# Patient Record
Sex: Female | Born: 1945 | Race: White | Hispanic: No | Smoking: Current every day smoker
Health system: Southern US, Community
[De-identification: ages and names within clinical notes are randomized; demographics above are authoritative.]

## PROBLEM LIST (undated history)

## (undated) DIAGNOSIS — N200 Calculus of kidney: Secondary | ICD-10-CM

## (undated) DIAGNOSIS — Q615 Medullary cystic kidney: Secondary | ICD-10-CM

## (undated) DIAGNOSIS — I2510 Atherosclerotic heart disease of native coronary artery without angina pectoris: Secondary | ICD-10-CM

## (undated) DIAGNOSIS — R55 Syncope and collapse: Secondary | ICD-10-CM

## (undated) DIAGNOSIS — I219 Acute myocardial infarction, unspecified: Secondary | ICD-10-CM

## (undated) DIAGNOSIS — F32A Depression, unspecified: Secondary | ICD-10-CM

## (undated) DIAGNOSIS — I240 Acute coronary thrombosis not resulting in myocardial infarction: Secondary | ICD-10-CM

## (undated) DIAGNOSIS — M1990 Unspecified osteoarthritis, unspecified site: Secondary | ICD-10-CM

## (undated) DIAGNOSIS — F419 Anxiety disorder, unspecified: Secondary | ICD-10-CM

## (undated) DIAGNOSIS — E785 Hyperlipidemia, unspecified: Secondary | ICD-10-CM

## (undated) DIAGNOSIS — E119 Type 2 diabetes mellitus without complications: Secondary | ICD-10-CM

## (undated) DIAGNOSIS — F329 Major depressive disorder, single episode, unspecified: Secondary | ICD-10-CM

## (undated) DIAGNOSIS — M2550 Pain in unspecified joint: Secondary | ICD-10-CM

## (undated) DIAGNOSIS — I10 Essential (primary) hypertension: Secondary | ICD-10-CM

## (undated) DIAGNOSIS — C539 Malignant neoplasm of cervix uteri, unspecified: Secondary | ICD-10-CM

## (undated) HISTORY — DX: Syncope and collapse: R55

## (undated) HISTORY — DX: Major depressive disorder, single episode, unspecified: F32.9

## (undated) HISTORY — DX: Acute myocardial infarction, unspecified: I21.9

## (undated) HISTORY — DX: Type 2 diabetes mellitus without complications: E11.9

## (undated) HISTORY — DX: Acute coronary thrombosis not resulting in myocardial infarction: I24.0

## (undated) HISTORY — DX: Medullary cystic kidney: Q61.5

## (undated) HISTORY — DX: Atherosclerotic heart disease of native coronary artery without angina pectoris: I25.10

## (undated) HISTORY — DX: Pain in unspecified joint: M25.50

## (undated) HISTORY — DX: Calculus of kidney: N20.0

## (undated) HISTORY — DX: Unspecified osteoarthritis, unspecified site: M19.90

## (undated) HISTORY — PX: REPLACEMENT TOTAL KNEE: SUR1224

## (undated) HISTORY — PX: OTHER SURGICAL HISTORY: SHX169

## (undated) HISTORY — DX: Anxiety disorder, unspecified: F41.9

## (undated) HISTORY — DX: Malignant neoplasm of cervix uteri, unspecified: C53.9

## (undated) HISTORY — DX: Depression, unspecified: F32.A

## (undated) HISTORY — DX: Hyperlipidemia, unspecified: E78.5

## (undated) HISTORY — DX: Essential (primary) hypertension: I10

---

## 2013-05-09 DIAGNOSIS — R928 Other abnormal and inconclusive findings on diagnostic imaging of breast: Secondary | ICD-10-CM | POA: Insufficient documentation

## 2014-10-15 DIAGNOSIS — M533 Sacrococcygeal disorders, not elsewhere classified: Secondary | ICD-10-CM | POA: Insufficient documentation

## 2016-10-21 ENCOUNTER — Ambulatory Visit (INDEPENDENT_AMBULATORY_CARE_PROVIDER_SITE_OTHER): Payer: Medicare Other | Admitting: Cardiovascular Disease

## 2016-10-21 ENCOUNTER — Encounter: Payer: Self-pay | Admitting: Cardiovascular Disease

## 2016-10-21 ENCOUNTER — Telehealth: Payer: Self-pay | Admitting: Cardiovascular Disease

## 2016-10-21 VITALS — BP 108/72 | HR 71 | Ht 69.0 in | Wt 209.0 lb

## 2016-10-21 DIAGNOSIS — F17210 Nicotine dependence, cigarettes, uncomplicated: Secondary | ICD-10-CM | POA: Diagnosis not present

## 2016-10-21 DIAGNOSIS — Z955 Presence of coronary angioplasty implant and graft: Secondary | ICD-10-CM

## 2016-10-21 DIAGNOSIS — I10 Essential (primary) hypertension: Secondary | ICD-10-CM

## 2016-10-21 DIAGNOSIS — I25118 Atherosclerotic heart disease of native coronary artery with other forms of angina pectoris: Secondary | ICD-10-CM | POA: Diagnosis not present

## 2016-10-21 DIAGNOSIS — R600 Localized edema: Secondary | ICD-10-CM | POA: Diagnosis not present

## 2016-10-21 DIAGNOSIS — E785 Hyperlipidemia, unspecified: Secondary | ICD-10-CM | POA: Diagnosis not present

## 2016-10-21 DIAGNOSIS — Z9889 Other specified postprocedural states: Secondary | ICD-10-CM

## 2016-10-21 DIAGNOSIS — R0609 Other forms of dyspnea: Secondary | ICD-10-CM | POA: Diagnosis not present

## 2016-10-21 DIAGNOSIS — Z716 Tobacco abuse counseling: Secondary | ICD-10-CM

## 2016-10-21 NOTE — Telephone Encounter (Signed)
Echo scheduled in [facility] 11/04/16  Exercise myoview - scheduled at [facility] 11/10/16

## 2016-10-21 NOTE — Progress Notes (Signed)
CARDIOLOGY CONSULT NOTE  Patient ID: Sandy Meyer MRN: [id number] DOB/AGE: 08/03/1945 71 y.o.  Admit date: (Not on file) Primary Physician: Jessee Avers, FNP Referring Physician: Eulas Post FNP  Reason for Consultation: CAD  HPI: Sandy Meyer is a 71 y.o. female who is being seen today for the evaluation of CAD at the request of Jessee Avers, FNP.   She has a history of coronary artery disease, hypertension, and hyperlipidemia. She recently moved here from [place], [place] area she reportedly had a PCI of the LAD in 2001 by Dr. Lissa Merlin in [place], [place].  At the time she had been power walking 16 miles per week. She tells me she had a 90% LAD stenosis. She used to follow with Dr. Ovidio Hanger in [place].  Symptoms at the time consisted of bilateral shoulder pain and significant dizziness.  Lipids 04/23/16: Total cholesterol 175, triglycerides 312, HDL 42, LDL 71.  I reviewed all relevant documentation, labs, and studies. Her cardiologist discussed stopping Plavix in February 2018 but she wanted to continue with it.  She has been having some bilateral lower extremity edema for the past 3 months and exertional dyspnea when carrying groceries or laundry baskets.She noticed bilateral ankle swelling when flying. She borrowed Lasix from her friend and lost 10 pounds in 4 days. She denies dizziness and arm pain. She also denies chest pain.  She smokes a half-pack of cigarettes daily. She quit for 25 years and restarted smoking at age 68.  She has some right flank pain related to kidney stones.  ECG performed in the office today which I ordered an personally interpreted demonstrated sinus rhythm with a nonspecific T wave abnormality.  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   Allergies  Allergen Reactions  . Azithromycin   . Bactrim [Sulfamethoxazole-Trimethoprim]     Hives, rash, swelling   . Celebrex [Celecoxib]     rash  . Sulfa Antibiotics      Current Outpatient Prescriptions  Medication Sig Dispense Refill  . amitriptyline (ELAVIL) 10 MG tablet Take 20 mg by mouth at bedtime.     [name] aspirin 81 MG chewable tablet Chew by mouth daily.    [name] atorvastatin (LIPITOR) 40 MG tablet Take 40 mg by mouth daily.    . baclofen (LIORESAL) 10 MG tablet Take 10 mg by mouth 3 (three) times daily.    . cetirizine (ZYRTEC) 10 MG tablet Take 10 mg by mouth daily.    . clopidogrel (PLAVIX) 75 MG tablet Take 75 mg by mouth daily.    . diclofenac (CATAFLAM) 50 MG tablet Take 50 mg by mouth 2 (two) times daily.     . diclofenac sodium (VOLTAREN) 1 % GEL Apply topically 4 (four) times daily.    [name] docusate sodium (COLACE) 100 MG capsule Take 100 mg by mouth 2 (two) times daily.    [name] estradiol (VIVELLE-DOT) 0.05 MG/24HR patch Place 1 patch onto the skin 2 (two) times a week.    . ezetimibe (ZETIA) 10 MG tablet Take 10 mg by mouth daily.    . furosemide (LASIX) 20 MG tablet Take 20 mg by mouth daily as needed.    . hydrochlorothiazide (HYDRODIURIL) 25 MG tablet Take 25 mg by mouth 2 (two) times daily.     [name] HYDROmorphone (DILAUDID) 2 MG tablet Take by mouth every 4 (four) hours as needed for severe pain.    [name] latanoprost (XALATAN) 0.005 % ophthalmic solution 1 drop at bedtime.    [name]  lisinopril (PRINIVIL,ZESTRIL) 20 MG tablet Take 20 mg by mouth daily.    [name] LORazepam (ATIVAN) 1 MG tablet Take 1 mg by mouth every 8 (eight) hours.    . Omega-3 Fatty Acids (FISH OIL) 1000 MG CAPS Take by mouth.    . potassium citrate (UROCIT-K) 10 MEQ (1080 MG) SR tablet Take 10 mEq by mouth 4 (four) times daily.     . sertraline (ZOLOFT) 100 MG tablet Take 100 mg by mouth daily.    . timolol (BETIMOL) 0.5 % ophthalmic solution 1 drop 2 (two) times daily.     No current facility-administered medications for this visit.     Past Medical History:  Diagnosis Date  . Anxiety   . Arthritis   . Blockage of coronary artery of heart ([place])   . CAD (coronary artery disease)    . Cervical cancer ([place])   . Coronary artery disease   . Depression   . Diabetes ([place])   . Dyslipidemia   . Hyperlipidemia   . Hypertension   . Joint pain   . Kidney stones   . MI (myocardial infarction) ([place])   . Syncope     Past Surgical History:  Procedure Laterality Date  . CESAREAN SECTION    . complete hysterectomy    . REPLACEMENT TOTAL KNEE      Social History   Social History  . Marital status: Divorced    Spouse name: N/A  . Number of children: N/A  . Years of education: N/A   Occupational History  . Not on file.   Social History Main Topics  . Smoking status: Current Every Day Smoker    Types: Cigarettes  . Smokeless tobacco: Never Used  . Alcohol use Not on file  . Drug use: Unknown  . Sexual activity: Not on file   Other Topics Concern  . Not on file   Social History Narrative  . No narrative on file     Fam Hx: father, mother, and brothers all had premature coronary artery disease and myocardial infarctions.  Current Meds  Medication Sig  . amitriptyline (ELAVIL) 10 MG tablet Take 20 mg by mouth at bedtime.   [name] aspirin 81 MG chewable tablet Chew by mouth daily.  [name] atorvastatin (LIPITOR) 40 MG tablet Take 40 mg by mouth daily.  . baclofen (LIORESAL) 10 MG tablet Take 10 mg by mouth 3 (three) times daily.  . cetirizine (ZYRTEC) 10 MG tablet Take 10 mg by mouth daily.  . clopidogrel (PLAVIX) 75 MG tablet Take 75 mg by mouth daily.  . diclofenac (CATAFLAM) 50 MG tablet Take 50 mg by mouth 2 (two) times daily.   . diclofenac sodium (VOLTAREN) 1 % GEL Apply topically 4 (four) times daily.  [name] docusate sodium (COLACE) 100 MG capsule Take 100 mg by mouth 2 (two) times daily.  [name] estradiol (VIVELLE-DOT) 0.05 MG/24HR patch Place 1 patch onto the skin 2 (two) times a week.  . ezetimibe (ZETIA) 10 MG tablet Take 10 mg by mouth daily.  . furosemide (LASIX) 20 MG tablet Take 20 mg by mouth daily as needed.  . hydrochlorothiazide (HYDRODIURIL) 25 MG tablet  Take 25 mg by mouth 2 (two) times daily.   [name] HYDROmorphone (DILAUDID) 2 MG tablet Take by mouth every 4 (four) hours as needed for severe pain.  [name] latanoprost (XALATAN) 0.005 % ophthalmic solution 1 drop at bedtime.  [name] lisinopril (PRINIVIL,ZESTRIL) 20 MG tablet Take 20 mg by mouth daily.  [name] LORazepam (ATIVAN)  1 MG tablet Take 1 mg by mouth every 8 (eight) hours.  . Omega-3 Fatty Acids (FISH OIL) 1000 MG CAPS Take by mouth.  . potassium citrate (UROCIT-K) 10 MEQ (1080 MG) SR tablet Take 10 mEq by mouth 4 (four) times daily.   . sertraline (ZOLOFT) 100 MG tablet Take 100 mg by mouth daily.  . timolol (BETIMOL) 0.5 % ophthalmic solution 1 drop 2 (two) times daily.  . [DISCONTINUED] atorvastatin (LIPITOR) 10 MG tablet Take 10 mg by mouth daily.      Review of systems complete and found to be negative unless listed above in HPI    Physical exam Blood pressure 108/72, pulse 71, height 5\' 9"  (1.753 m), weight 209 lb (94.8 kg), SpO2 97 %. General: NAD Neck: No JVD, no thyromegaly or thyroid nodule.  Lungs: Clear to auscultation bilaterally with normal respiratory effort. CV: Nondisplaced PMI. Regular rate and rhythm, normal S1/S2, no S3/S4, no murmur.  No peripheral edema.  No carotid bruit.    Abdomen: Soft, nontender, no distention.  Skin: Intact without lesions or rashes.  Neurologic: Alert and oriented x 3.  Psych: Normal affect. Extremities: No clubbing or cyanosis.  HEENT: Normal.   ECG: Most recent ECG reviewed.   Labs: No results found for: K, BUN, CREATININE, ALT, TSH, HGB   Lipids: No results found for: LDLCALC, LDLDIRECT, CHOL, TRIG, HDL      ASSESSMENT AND PLAN:  1. Coronary artery disease status post LAD stent in 2001 with new onset exertional dyspnea and bilateral leg edema: I will order a 2-D echocardiogram with Doppler to evaluate cardiac structure, function, and regional wall motion. I will proceed with a nuclear myocardial perfusion imaging study to evaluate for  ischemic heart disease (exercise Myoview). If cardiac workup is unrevealing, I would obtain spirometry given her long history of tobacco abuse. Continue aspirin and Lipitor. I will stop Plavix.  2. Hypertension:Controlled on hydrochlorothiazide and lisinopril. No changes.  3. Hyperlipidemia: Lipids reviewed above. Continue Lipitor 40 mg and Zetia 10 mg.  4. Tobacco abuse: Cessation consult provided (3 minutes).  Disposition: Follow up in 6 weeks  Signed: Kate Sable, M.D., F.A.C.C.  10/21/2016, 1:50 PM

## 2016-10-21 NOTE — Patient Instructions (Signed)
Medication Instructions:  Your physician has recommended you make the following change in your medication:   STOP Plavix  Labwork: NONE  Testing/Procedures: Your physician has requested that you have an echocardiogram. Echocardiography is a painless test that uses sound waves to create images of your heart. It provides your doctor with information about the size and shape of your heart and how well your heart's chambers and valves are working. This procedure takes approximately one hour. There are no restrictions for this procedure.  Your physician has requested that you have en exercise stress myoview. For further information please visit [email or web address]. Please follow instruction sheet, as given.  Follow-Up: Your physician recommends that you schedule a follow-up appointment in: [place]. [facility] [place].  Any Other Special Instructions Will Be Listed Below (If Applicable).  If you need a refill on your cardiac medications before your next appointment, please call your pharmacy.

## 2016-11-04 ENCOUNTER — Ambulatory Visit (INDEPENDENT_AMBULATORY_CARE_PROVIDER_SITE_OTHER): Payer: Medicare Other

## 2016-11-04 ENCOUNTER — Other Ambulatory Visit: Payer: Self-pay

## 2016-11-04 DIAGNOSIS — R600 Localized edema: Secondary | ICD-10-CM | POA: Diagnosis not present

## 2016-11-04 DIAGNOSIS — R0609 Other forms of dyspnea: Secondary | ICD-10-CM | POA: Diagnosis not present

## 2016-11-05 ENCOUNTER — Telehealth: Payer: Self-pay | Admitting: *Deleted

## 2016-11-05 NOTE — Telephone Encounter (Signed)
Notes recorded by Laurine Blazer, LPN on 4/44/5848 at 3:50 PM EDT Patient notified. Copy to pmd. Stress test scheduled for 11/10/2016 & follow up already scheduled for 12/03/2016. ------  Notes recorded by Herminio Commons, MD on 11/04/2016 at 4:58 PM EDT Normal pumping function.

## 2016-11-10 ENCOUNTER — Encounter (HOSPITAL_BASED_OUTPATIENT_CLINIC_OR_DEPARTMENT_OTHER)
Admission: RE | Admit: 2016-11-10 | Discharge: 2016-11-10 | Disposition: A | Discharge status: Home / Self Care | Payer: Medicare Other | Source: Ambulatory Visit | Attending: Cardiovascular Disease | Admitting: Cardiovascular Disease

## 2016-11-10 ENCOUNTER — Encounter (HOSPITAL_COMMUNITY)
Admission: RE | Admit: 2016-11-10 | Discharge: 2016-11-10 | Disposition: A | Discharge status: Home / Self Care | Payer: Medicare Other | Source: Ambulatory Visit | Attending: Cardiovascular Disease | Admitting: Cardiovascular Disease

## 2016-11-10 ENCOUNTER — Encounter (HOSPITAL_COMMUNITY): Payer: Self-pay

## 2016-11-10 DIAGNOSIS — R600 Localized edema: Secondary | ICD-10-CM | POA: Insufficient documentation

## 2016-11-10 DIAGNOSIS — R0609 Other forms of dyspnea: Secondary | ICD-10-CM

## 2016-11-10 LAB — NM MYOCAR MULTI W/SPECT W/WALL MOTION / EF
CHL CUP MPHR: 152 {beats}/min
CHL CUP NUCLEAR SDS: 0
CHL CUP RESTING HR STRESS: 65 {beats}/min
CSEPHR: 88 %
CSEPPHR: 134 {beats}/min
Estimated workload: 7 METS
Exercise duration (min): 6 min
Exercise duration (sec): 16 s
LVDIAVOL: 50 mL (ref 46–106)
LVSYSVOL: 16 mL
RATE: 0.66
RPE: 13
SRS: 2
SSS: 2
TID: 1.06

## 2016-11-10 MED ORDER — TECHNETIUM TC 99M TETROFOSMIN IV KIT
10.0000 | PACK | Freq: Once | INTRAVENOUS | Status: AC | PRN
  Administered 2016-11-10: 11 via INTRAVENOUS

## 2016-11-10 MED ORDER — SODIUM CHLORIDE 0.9% FLUSH
INTRAVENOUS | Status: AC
  Filled 2016-11-10: qty 10

## 2016-11-10 MED ORDER — REGADENOSON 0.4 MG/5ML IV SOLN
INTRAVENOUS | Status: AC
  Filled 2016-11-10: qty 5

## 2016-11-10 MED ORDER — TECHNETIUM TC 99M TETROFOSMIN IV KIT
30.0000 | PACK | Freq: Once | INTRAVENOUS | Status: AC | PRN
  Administered 2016-11-10: 30 via INTRAVENOUS

## 2016-11-11 ENCOUNTER — Telehealth: Payer: Self-pay | Admitting: *Deleted

## 2016-11-11 NOTE — Telephone Encounter (Signed)
Notes recorded by Laurine Blazer, LPN on 8/59/2763 at 94:32 PM EDT Patient notified. Copy to pmd. Follow up already scheduled for 12/03/2016. ------  Notes recorded by Laurine Blazer, LPN on 0/06/7942 at 46:19 PM EDT Left message to return call.  ------  Notes recorded by Herminio Commons, MD on 11/10/2016 at 3:42 PM EDT Normal.

## 2016-12-03 ENCOUNTER — Encounter: Payer: Self-pay | Admitting: Cardiovascular Disease

## 2016-12-03 ENCOUNTER — Ambulatory Visit (INDEPENDENT_AMBULATORY_CARE_PROVIDER_SITE_OTHER): Payer: Medicare Other | Admitting: Cardiovascular Disease

## 2016-12-03 VITALS — BP 98/70 | HR 76 | Ht 69.0 in | Wt 204.8 lb

## 2016-12-03 DIAGNOSIS — R600 Localized edema: Secondary | ICD-10-CM | POA: Diagnosis not present

## 2016-12-03 DIAGNOSIS — Z716 Tobacco abuse counseling: Secondary | ICD-10-CM | POA: Diagnosis not present

## 2016-12-03 DIAGNOSIS — I209 Angina pectoris, unspecified: Secondary | ICD-10-CM

## 2016-12-03 DIAGNOSIS — R0609 Other forms of dyspnea: Secondary | ICD-10-CM | POA: Diagnosis not present

## 2016-12-03 DIAGNOSIS — I25118 Atherosclerotic heart disease of native coronary artery with other forms of angina pectoris: Secondary | ICD-10-CM

## 2016-12-03 DIAGNOSIS — E785 Hyperlipidemia, unspecified: Secondary | ICD-10-CM

## 2016-12-03 DIAGNOSIS — Z955 Presence of coronary angioplasty implant and graft: Secondary | ICD-10-CM | POA: Diagnosis not present

## 2016-12-03 DIAGNOSIS — I10 Essential (primary) hypertension: Secondary | ICD-10-CM

## 2016-12-03 NOTE — Patient Instructions (Signed)

## 2016-12-03 NOTE — Progress Notes (Signed)
SUBJECTIVE: The patient returns for follow-up after undergoing cardiovascular testing performed for the evaluation of exertional dyspnea and leg edema.  Nuclear stress test on 11/10/16 was normal.  Echocardiogram demonstrated normal left ventricular systolic function and regional wall motion, LVEF 60-65%. There was mild mitral regurgitation and grade indeterminate diastolic dysfunction.  She has a history of coronary artery disease, hypertension, and hyperlipidemia. She recently moved here from [place], [place] area she reportedly had a PCI of the LAD in 2001 by Dr. Lissa Meyer in [place], [place].  At the time she had been power walking 16 miles per week. She tells me she had a 90% LAD stenosis. She used to follow with Dr. Ovidio Meyer in [place].  She is feeling better. She denies chest pain and no longer complains of shortness of breath.    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  Review of Systems: As per "subjective", otherwise negative.  Allergies  Allergen Reactions  . Azithromycin   . Bactrim [Sulfamethoxazole-Trimethoprim]     Hives, rash, swelling   . Celebrex [Celecoxib]     rash  . Sulfa Antibiotics     Current Outpatient Prescriptions  Medication Sig Dispense Refill  . amitriptyline (ELAVIL) 10 MG tablet Take 20 mg by mouth at bedtime.     [name] aspirin 81 MG chewable tablet Chew by mouth daily.    [name] atorvastatin (LIPITOR) 40 MG tablet Take 40 mg by mouth daily.    . baclofen (LIORESAL) 10 MG tablet Take 10 mg by mouth 3 (three) times daily.    . cetirizine (ZYRTEC) 10 MG tablet Take 10 mg by mouth daily.    . diclofenac (CATAFLAM) 50 MG tablet Take 50 mg by mouth 2 (two) times daily.     . diclofenac sodium (VOLTAREN) 1 % GEL Apply topically 4 (four) times daily.    [name] docusate sodium (COLACE) 100 MG capsule Take 100 mg by mouth 2 (two) times daily.    [name] estradiol (VIVELLE-DOT) 0.05 MG/24HR patch Place 1 patch onto the skin 2 (two) times a week.    .  ezetimibe (ZETIA) 10 MG tablet Take 10 mg by mouth daily.    . furosemide (LASIX) 20 MG tablet Take 20 mg by mouth daily as needed.    . hydrochlorothiazide (HYDRODIURIL) 25 MG tablet Take 25 mg by mouth 2 (two) times daily.     [name] HYDROmorphone (DILAUDID) 2 MG tablet Take by mouth every 4 (four) hours as needed for severe pain.    [name] latanoprost (XALATAN) 0.005 % ophthalmic solution 1 drop at bedtime.    [name] lisinopril (PRINIVIL,ZESTRIL) 20 MG tablet Take 20 mg by mouth daily.    [name] LORazepam (ATIVAN) 1 MG tablet Take 1 mg by mouth every 8 (eight) hours.    . Omega-3 Fatty Acids (FISH OIL) 1000 MG CAPS Take by mouth.    . potassium citrate (UROCIT-K) 10 MEQ (1080 MG) SR tablet Take 10 mEq by mouth 4 (four) times daily.     . sertraline (ZOLOFT) 100 MG tablet Take 100 mg by mouth daily.    . timolol (BETIMOL) 0.5 % ophthalmic solution 1 drop 2 (two) times daily.     No current facility-administered medications for this visit.     Past Medical History:  Diagnosis Date  . Anxiety   . Arthritis   . Blockage of coronary artery of heart ([place])   . CAD (coronary artery disease)   . Cervical cancer ([place])   .  Coronary artery disease   . Depression   . Diabetes ([place])   . Dyslipidemia   . Hyperlipidemia   . Hypertension   . Joint pain   . Kidney stones   . MI (myocardial infarction) ([place])   . Syncope     Past Surgical History:  Procedure Laterality Date  . CESAREAN SECTION    . complete hysterectomy    . REPLACEMENT TOTAL KNEE      Social History   Social History  . Marital status: Divorced    Spouse name: N/A  . Number of children: N/A  . Years of education: N/A   Occupational History  . Not on file.   Social History Main Topics  . Smoking status: Current Every Day Smoker    Packs/day: 0.25    Types: Cigarettes  . Smokeless tobacco: Never Used  . Alcohol use Not on file  . Drug use: Unknown  . Sexual activity: Not on file   Other Topics Concern  . Not on file   Social  History Narrative  . No narrative on file     Vitals:   12/03/16 1457  BP: 98/70  Pulse: 76  SpO2: 97%  Weight: 204 lb 12.8 oz (92.9 kg)  Height: 5\' 9"  (1.753 m)    Wt Readings from Last 3 Encounters:  12/03/16 204 lb 12.8 oz (92.9 kg)  10/21/16 209 lb (94.8 kg)     PHYSICAL EXAM General: NAD HEENT: Normal. Neck: No JVD, no thyromegaly. Lungs: Clear to auscultation bilaterally with normal respiratory effort. CV: Nondisplaced PMI.  Regular rate and rhythm, normal S1/S2, no S3/S4, no murmur. No pretibial or periankle edema.  Abdomen: Soft, nontender, no distention.  Neurologic: Alert and oriented.  Psych: Normal affect. Skin: Normal. Musculoskeletal: No gross deformities.    ECG: Most recent ECG reviewed.   Labs: No results found for: K, BUN, CREATININE, ALT, TSH, HGB   Lipids: No results found for: LDLCALC, LDLDIRECT, CHOL, TRIG, HDL     ASSESSMENT AND PLAN:  1. Coronary artery disease status post LAD stent in 2001 with new onset exertional dyspnea and bilateral leg edema: Symptoms have improved. Normal stress test and normal LV systolic function by echocardiogram. If symptoms return, I would obtain spirometry given her long history of tobacco abuse. Continue aspirin and Lipitor.   2. Hypertension: Controlled on hydrochlorothiazide and lisinopril. No changes.  3. Hyperlipidemia: Lipids previously reviewed. Continue Lipitor 40 mg and Zetia 10 mg.  4. Tobacco abuse: Cessation counseling previously provided.     Disposition: Follow up 6 months.   Sandy Meyer, M.D., F.A.C.C.

## 2016-12-15 ENCOUNTER — Ambulatory Visit (INDEPENDENT_AMBULATORY_CARE_PROVIDER_SITE_OTHER): Payer: Medicare Other | Admitting: Urology

## 2016-12-15 DIAGNOSIS — N200 Calculus of kidney: Secondary | ICD-10-CM | POA: Diagnosis not present

## 2017-02-16 ENCOUNTER — Ambulatory Visit (HOSPITAL_COMMUNITY)
Admission: RE | Admit: 2017-02-16 | Discharge: 2017-02-16 | Disposition: A | Discharge status: Home / Self Care | Payer: Medicare Other | Source: Ambulatory Visit | Attending: Urology | Admitting: Urology

## 2017-02-16 ENCOUNTER — Ambulatory Visit (HOSPITAL_COMMUNITY)
Admission: RE | Admit: 2017-02-16 | Discharge: 2017-02-16 | Disposition: A | Discharge status: Home / Self Care | Payer: Medicare Other | Source: Ambulatory Visit | Attending: Neurology | Admitting: Neurology

## 2017-02-16 ENCOUNTER — Other Ambulatory Visit: Payer: Self-pay | Admitting: Neurology

## 2017-02-16 ENCOUNTER — Other Ambulatory Visit: Payer: Self-pay | Admitting: Urology

## 2017-02-16 DIAGNOSIS — R93421 Abnormal radiologic findings on diagnostic imaging of right kidney: Secondary | ICD-10-CM | POA: Diagnosis not present

## 2017-02-16 DIAGNOSIS — M5137 Other intervertebral disc degeneration, lumbosacral region: Secondary | ICD-10-CM | POA: Diagnosis not present

## 2017-02-16 DIAGNOSIS — N200 Calculus of kidney: Secondary | ICD-10-CM

## 2017-02-16 DIAGNOSIS — M545 Low back pain: Secondary | ICD-10-CM

## 2017-02-16 DIAGNOSIS — M5136 Other intervertebral disc degeneration, lumbar region: Secondary | ICD-10-CM | POA: Insufficient documentation

## 2017-02-16 DIAGNOSIS — R140 Abdominal distension (gaseous): Secondary | ICD-10-CM | POA: Diagnosis not present

## 2017-02-16 DIAGNOSIS — R93422 Abnormal radiologic findings on diagnostic imaging of left kidney: Secondary | ICD-10-CM | POA: Diagnosis not present

## 2017-02-16 DIAGNOSIS — I700 Atherosclerosis of aorta: Secondary | ICD-10-CM | POA: Diagnosis not present

## 2017-07-13 ENCOUNTER — Ambulatory Visit (INDEPENDENT_AMBULATORY_CARE_PROVIDER_SITE_OTHER): Payer: Medicare Other | Admitting: Urology

## 2017-07-13 DIAGNOSIS — N200 Calculus of kidney: Secondary | ICD-10-CM

## 2017-08-02 ENCOUNTER — Other Ambulatory Visit: Payer: Self-pay | Admitting: Cardiovascular Disease

## 2017-08-02 MED ORDER — LISINOPRIL 20 MG PO TABS: 20.0000 mg | ORAL_TABLET | Freq: Every day | ORAL | 1 refills | Status: DC

## 2017-08-02 NOTE — Telephone Encounter (Signed)
°  1. Which medications need to be refilled? (please list name of each medication and dose if known) lisinopril (PRINIVIL,ZESTRIL) 20 MG tablet    2. Which pharmacy/location (including street and city if local pharmacy) is medication to be sent to?   [facility]  3. Do they need a 30 day or 90 day supply?

## 2017-08-02 NOTE — Telephone Encounter (Signed)
Appt scheduled. Rx sent

## 2017-08-30 ENCOUNTER — Other Ambulatory Visit: Payer: Self-pay | Admitting: Cardiovascular Disease

## 2017-09-03 ENCOUNTER — Ambulatory Visit (INDEPENDENT_AMBULATORY_CARE_PROVIDER_SITE_OTHER): Payer: Medicare Other | Admitting: Cardiovascular Disease

## 2017-09-03 ENCOUNTER — Encounter: Payer: Self-pay | Admitting: Cardiovascular Disease

## 2017-09-03 ENCOUNTER — Other Ambulatory Visit: Payer: Self-pay

## 2017-09-03 VITALS — BP 125/75 | HR 71 | Ht 69.0 in | Wt 208.0 lb

## 2017-09-03 DIAGNOSIS — E785 Hyperlipidemia, unspecified: Secondary | ICD-10-CM

## 2017-09-03 DIAGNOSIS — I25118 Atherosclerotic heart disease of native coronary artery with other forms of angina pectoris: Secondary | ICD-10-CM | POA: Diagnosis not present

## 2017-09-03 DIAGNOSIS — Z955 Presence of coronary angioplasty implant and graft: Secondary | ICD-10-CM

## 2017-09-03 DIAGNOSIS — I10 Essential (primary) hypertension: Secondary | ICD-10-CM

## 2017-09-03 MED ORDER — HYDROCHLOROTHIAZIDE 25 MG PO TABS: 25.0000 mg | ORAL_TABLET | Freq: Two times a day (BID) | ORAL | 3 refills | Status: DC

## 2017-09-03 MED ORDER — ATORVASTATIN CALCIUM 40 MG PO TABS: 40.0000 mg | ORAL_TABLET | Freq: Every day | ORAL | 3 refills | Status: DC

## 2017-09-03 MED ORDER — EZETIMIBE 10 MG PO TABS: 10.0000 mg | ORAL_TABLET | Freq: Every day | ORAL | 3 refills | Status: DC

## 2017-09-03 NOTE — Progress Notes (Signed)
SUBJECTIVE: The patient returns for follow-up of coronary artery disease.  She had an LAD stent placed in 2001 (by Dr. Lissa Merlin in [place], [place]   Nuclear stress test on 11/10/16 was normal.  Echocardiogram demonstrated normal left ventricular systolic function and regional wall motion, LVEF 60-65%. There was mild mitral regurgitation and grade indeterminate diastolic dysfunction.  I personally reviewed the ECG performed today which demonstrated sinus rhythm with incomplete right bundle branch block and nonspecific T wave abnormality's.  The patient denies any symptoms of chest pain, palpitations, shortness of breath, lightheadedness, dizziness, leg swelling, orthopnea, PND, and syncope.     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 She is engaged.  Review of Systems: As per "subjective", otherwise negative.  Allergies  Allergen Reactions  . Azithromycin   . Bactrim [Sulfamethoxazole-Trimethoprim]     Hives, rash, swelling   . Celebrex [Celecoxib]     rash  . Sulfa Antibiotics     Current Outpatient Medications  Medication Sig Dispense Refill  . amitriptyline (ELAVIL) 10 MG tablet Take 20 mg by mouth at bedtime.     [name] aspirin 81 MG chewable tablet Chew by mouth daily.    [name] atorvastatin (LIPITOR) 40 MG tablet Take 40 mg by mouth daily.    . cetirizine (ZYRTEC) 10 MG tablet Take 10 mg by mouth daily.    . diclofenac sodium (VOLTAREN) 1 % GEL Apply topically 4 (four) times daily.    [name] estradiol (VIVELLE-DOT) 0.05 MG/24HR patch Place 1 patch onto the skin 2 (two) times a week.    . ezetimibe (ZETIA) 10 MG tablet TAKE 1 TABLET BY MOUTH EVERY DAY 30 tablet 0  . furosemide (LASIX) 20 MG tablet Take 20 mg by mouth daily as needed.    . hydrochlorothiazide (HYDRODIURIL) 25 MG tablet Take 25 mg by mouth 2 (two) times daily.     [name] HYDROmorphone (DILAUDID) 2 MG tablet Take by mouth every 4 (four) hours as needed for severe pain.    [name] latanoprost (XALATAN) 0.005 %  ophthalmic solution 1 drop at bedtime.    [name] lisinopril (PRINIVIL,ZESTRIL) 20 MG tablet Take 1 tablet (20 mg total) by mouth daily. 30 tablet 1  . LORazepam (ATIVAN) 1 MG tablet Take 1 mg by mouth every 8 (eight) hours.    . meloxicam (MOBIC) 7.5 MG tablet TK 1 T PO BID  5  . Omega-3 Fatty Acids (FISH OIL) 1000 MG CAPS Take by mouth.    . potassium citrate (UROCIT-K) 10 MEQ (1080 MG) SR tablet Take 10 mEq by mouth 4 (four) times daily.     . sertraline (ZOLOFT) 100 MG tablet Take 100 mg by mouth daily.    . timolol (BETIMOL) 0.5 % ophthalmic solution 1 drop 2 (two) times daily.     No current facility-administered medications for this visit.     Past Medical History:  Diagnosis Date  . Anxiety   . Arthritis   . Blockage of coronary artery of heart ([place])   . CAD (coronary artery disease)   . Cervical cancer ([place])   . Coronary artery disease   . Depression   . Diabetes ([place])   . Dyslipidemia   . Hyperlipidemia   . Hypertension   . Joint pain   . Kidney stones   . MI (myocardial infarction) ([place])   . Syncope     Past Surgical History:  Procedure Laterality Date  . CESAREAN SECTION    . complete hysterectomy    .  REPLACEMENT TOTAL KNEE      Social History   Socioeconomic History  . Marital status: Divorced    Spouse name: Not on file  . Number of children: Not on file  . Years of education: Not on file  . Highest education level: Not on file  Occupational History  . Not on file  Social Needs  . Financial resource strain: Not on file  . Food insecurity:    Worry: Not on file    Inability: Not on file  . Transportation needs:    Medical: Not on file    Non-medical: Not on file  Tobacco Use  . Smoking status: Current Every Day Smoker    Packs/day: 0.25    Types: Cigarettes  . Smokeless tobacco: Never Used  Substance and Sexual Activity  . Alcohol use: Not on file  . Drug use: Not on file  . Sexual activity: Not on file  Lifestyle  . Physical activity:     Days per week: Not on file    Minutes per session: Not on file  . Stress: Not on file  Relationships  . Social connections:    Talks on phone: Not on file    Gets together: Not on file    Attends religious service: Not on file    Active member of club or organization: Not on file    Attends meetings of clubs or organizations: Not on file    Relationship status: Not on file  . Intimate partner violence:    Fear of current or ex partner: Not on file    Emotionally abused: Not on file    Physically abused: Not on file    Forced sexual activity: Not on file  Other Topics Concern  . Not on file  Social History Narrative  . Not on file     Vitals:   09/03/17 1556  BP: 125/75  Pulse: 71  SpO2: 94%  Weight: 208 lb (94.3 kg)  Height: 5\' 9"  (1.753 m)    Wt Readings from Last 3 Encounters:  09/03/17 208 lb (94.3 kg)  12/03/16 204 lb 12.8 oz (92.9 kg)  10/21/16 209 lb (94.8 kg)     PHYSICAL EXAM General: NAD HEENT: Normal. Neck: No JVD, no thyromegaly. Lungs: Clear to auscultation bilaterally with normal respiratory effort. CV: Regular rate and rhythm, normal S1/S2, no S3/S4, no murmur. No pretibial or periankle edema.  No carotid bruit.   Abdomen: Soft, nontender, no distention.  Neurologic: Alert and oriented.  Psych: Normal affect. Skin: Normal. Musculoskeletal: No gross deformities.    ECG: Most recent ECG reviewed.   Labs: No results found for: K, BUN, CREATININE, ALT, TSH, HGB   Lipids: No results found for: LDLCALC, LDLDIRECT, CHOL, TRIG, HDL     ASSESSMENT AND PLAN:  1. Coronary artery disease status post LAD stent in 2001:  Stable ischemic heart disease. Normal stress test and normal LV systolic function by echocardiogram. Continue aspirin and Lipitor.   2. Hypertension: Controlled on hydrochlorothiazide and lisinopril. No changes.  3. Hyperlipidemia: I will obtain a copy of lipids from PCP for personal review. Continue Lipitor 40 mg and Zetia 10  mg.  4. Tobacco abuse:Cessation counseling previously provided.     Disposition: Follow up 1 year.   Kate Sable, M.D., F.A.C.C.

## 2017-09-03 NOTE — Patient Instructions (Signed)

## 2017-09-16 ENCOUNTER — Encounter (HOSPITAL_COMMUNITY): Payer: Self-pay | Admitting: Emergency Medicine

## 2017-09-16 ENCOUNTER — Emergency Department (HOSPITAL_COMMUNITY)
Admission: EM | Admit: 2017-09-16 | Discharge: 2017-09-16 | Disposition: A | Discharge status: Home / Self Care | Payer: Medicare Other | Attending: Emergency Medicine | Admitting: Emergency Medicine

## 2017-09-16 ENCOUNTER — Telehealth: Payer: Self-pay

## 2017-09-16 ENCOUNTER — Other Ambulatory Visit: Payer: Self-pay

## 2017-09-16 ENCOUNTER — Emergency Department (HOSPITAL_COMMUNITY): Payer: Medicare Other

## 2017-09-16 DIAGNOSIS — R072 Precordial pain: Secondary | ICD-10-CM | POA: Diagnosis not present

## 2017-09-16 DIAGNOSIS — Z79899 Other long term (current) drug therapy: Secondary | ICD-10-CM | POA: Insufficient documentation

## 2017-09-16 DIAGNOSIS — Z8541 Personal history of malignant neoplasm of cervix uteri: Secondary | ICD-10-CM | POA: Diagnosis not present

## 2017-09-16 DIAGNOSIS — Z7982 Long term (current) use of aspirin: Secondary | ICD-10-CM | POA: Diagnosis not present

## 2017-09-16 DIAGNOSIS — E119 Type 2 diabetes mellitus without complications: Secondary | ICD-10-CM | POA: Diagnosis not present

## 2017-09-16 DIAGNOSIS — F17210 Nicotine dependence, cigarettes, uncomplicated: Secondary | ICD-10-CM | POA: Diagnosis not present

## 2017-09-16 DIAGNOSIS — I2510 Atherosclerotic heart disease of native coronary artery without angina pectoris: Secondary | ICD-10-CM | POA: Diagnosis not present

## 2017-09-16 DIAGNOSIS — I252 Old myocardial infarction: Secondary | ICD-10-CM | POA: Diagnosis not present

## 2017-09-16 LAB — CBC WITH DIFFERENTIAL/PLATELET
BASOS ABS: 0 10*3/uL (ref 0.0–0.1)
Basophils Relative: 0 %
Eosinophils Absolute: 0.7 10*3/uL (ref 0.0–0.7)
Eosinophils Relative: 7 %
HEMATOCRIT: 45.4 % (ref 36.0–46.0)
HEMOGLOBIN: 15 g/dL (ref 12.0–15.0)
LYMPHS ABS: 2.8 10*3/uL (ref 0.7–4.0)
LYMPHS PCT: 28 %
MCH: 31.8 pg (ref 26.0–34.0)
MCHC: 33 g/dL (ref 30.0–36.0)
MCV: 96.4 fL (ref 78.0–100.0)
Monocytes Absolute: 0.6 10*3/uL (ref 0.1–1.0)
Monocytes Relative: 6 %
NEUTROS ABS: 6 10*3/uL (ref 1.7–7.7)
NEUTROS PCT: 59 %
PLATELETS: 220 10*3/uL (ref 150–400)
RBC: 4.71 MIL/uL (ref 3.87–5.11)
RDW: 13.7 % (ref 11.5–15.5)
WBC: 10.2 10*3/uL (ref 4.0–10.5)

## 2017-09-16 LAB — TROPONIN I: Troponin I: <0.03 ng/mL (ref <0.03)

## 2017-09-16 LAB — BASIC METABOLIC PANEL
ANION GAP: 10 (ref 5–15)
BUN: 15 mg/dL (ref 6–20)
CHLORIDE: 99 mmol/L — AB (ref 101–111)
CO2: 30 mmol/L (ref 22–32)
Calcium: 9 mg/dL (ref 8.9–10.3)
Creatinine, Ser: 1.17 mg/dL — ABNORMAL HIGH (ref 0.44–1.00)
GFR calc Af Amer: 54 mL/min — ABNORMAL LOW (ref >60)
GFR, EST NON AFRICAN AMERICAN: 46 mL/min — AB (ref >60)
Glucose, Bld: 121 mg/dL — ABNORMAL HIGH (ref 65–99)
POTASSIUM: 3.4 mmol/L — AB (ref 3.5–5.1)
SODIUM: 139 mmol/L (ref 135–145)

## 2017-09-16 LAB — CBG MONITORING, ED: Glucose-Capillary: 120 mg/dL — ABNORMAL HIGH (ref 65–99)

## 2017-09-16 MED ORDER — FAMOTIDINE 20 MG PO TABS: 20.0000 mg | ORAL_TABLET | Freq: Two times a day (BID) | ORAL | 0 refills | Status: AC

## 2017-09-16 NOTE — ED Triage Notes (Signed)
Pt c/o htn and pain to mid chest intermittent x 2 days. States pain is sharp and quick. Denies any other sx with or with out pain

## 2017-09-16 NOTE — ED Notes (Signed)
EDP ok to eat

## 2017-09-16 NOTE — Discharge Instructions (Addendum)
Work-up here today for the chest pain is negative.  We will do a trial of Pepcid to see if it helps the chest pain.  May be indigestion.  Make an appointment to follow-up with cardiology they may want to do a stress test.  Return for any new or worse symptoms.

## 2017-09-16 NOTE — Telephone Encounter (Signed)
Patient contacted office stating for the past 3 days she has been having chest pain on and off. Patient typically has low BP but her BP today is 143/98 with HR 73. Patient states when the chest pain comes on, it is a 9/10. Patient states she has not had any other symptoms with this and it has not radiated anywhere. Advised patient it would be best for her to go to the ER to be evaluated. Patient verbalized understanding and states she will have her husband take her to [facility]

## 2017-09-16 NOTE — ED Notes (Signed)
Pt now states she took one ntg this am and helped the pain

## 2017-09-16 NOTE — ED Provider Notes (Signed)
[facility] EMERGENCY DEPARTMENT Provider Note   CSN: [id number] Arrival date & time: 09/16/17  1752     History   Chief Complaint Chief Complaint  Patient presents with  . Chest Pain    HPI Sandy Meyer is a 71 y.o. female.  Patient originally from [place].  Patient with stent placed in 2001.  Just recently on May 10 seen here by cardiology.  Everything checked out fine but patient was not having any symptoms.  They plan to follow her up in a year.  But 3 days ago patient had epigastric pain patient tried some Prilosec without any help.  On Tuesday it moved into the substernal part of the chest.  On Wednesday she had pain intermittently on and off all day the longest it lasted was 20 minutes.  Today long as it last was 5 minutes substernal still intermittent.  Continuing throughout the day happening more often today.  But did happen quite frequently yesterday.  Patient denies any leg swelling.  Not associated with any shortness of breath no back pain.  Is associated with nausea but no vomiting.  Patient describes it more as a pressure initially she thought it was indigestion but as stated above the Prilosec did not help.     Past Medical History:  Diagnosis Date  . Anxiety   . Arthritis   . Blockage of coronary artery of heart ([place])   . CAD (coronary artery disease)   . Cervical cancer ([place])   . Coronary artery disease   . Depression   . Diabetes ([place])   . Dyslipidemia   . Hyperlipidemia   . Hypertension   . Joint pain   . Kidney stones   . MI (myocardial infarction) ([place])   . Syncope     There are no active problems to display for this patient.   Past Surgical History:  Procedure Laterality Date  . CESAREAN SECTION    . complete hysterectomy    . REPLACEMENT TOTAL KNEE       OB History   None      Home Medications    Prior to Admission medications   Medication Sig Start Date End Date Taking? Authorizing Provider  amitriptyline (ELAVIL) 10 MG  tablet Take 20 mg by mouth at bedtime.    Yes [provider]  aspirin 81 MG chewable tablet Chew 81 mg by mouth every evening.    Yes [provider]  atorvastatin (LIPITOR) 40 MG tablet Take 1 tablet (40 mg total) by mouth daily. Patient taking differently: Take 40 mg by mouth every evening.  09/03/17  Yes Herminio Commons, MD  cetirizine (ZYRTEC) 10 MG tablet Take 10 mg by mouth every evening.    Yes [provider]  estradiol (VIVELLE-DOT) 0.05 MG/24HR patch Place 1 patch onto the skin every Sunday.    Yes [provider]  ezetimibe (ZETIA) 10 MG tablet Take 1 tablet (10 mg total) by mouth daily. 09/03/17  Yes Herminio Commons, MD  gabapentin (NEURONTIN) 300 MG capsule Take 300 mg by mouth 2 (two) times daily. 08/25/17  Yes [provider]  hydrochlorothiazide (HYDRODIURIL) 25 MG tablet Take 1 tablet (25 mg total) by mouth 2 (two) times daily. 09/03/17  Yes Herminio Commons, MD  HYDROmorphone (DILAUDID) 2 MG tablet Take by mouth every 4 (four) hours as needed for severe pain.   Yes [provider]  latanoprost (XALATAN) 0.005 % ophthalmic solution Place 1 drop into both eyes at bedtime.  Yes [provider]  lisinopril (PRINIVIL,ZESTRIL) 20 MG tablet Take 1 tablet (20 mg total) by mouth daily. 08/02/17  Yes Herminio Commons, MD  LORazepam (ATIVAN) 1 MG tablet Take 2 mg by mouth at bedtime.    Yes [provider]  meloxicam (MOBIC) 7.5 MG tablet TAKE ONE TABLET BY MOUTH DAILY AT BEDTIME AS NEEDED FOR PAIN/ARTHRITIS 06/23/17  Yes [provider]  methocarbamol (ROBAXIN) 500 MG tablet Take 500 mg by mouth at bedtime. 08/31/17  Yes [provider]  nitroGLYCERIN (NITROSTAT) 0.4 MG SL tablet Place 0.4 mg under the tongue every 5 (five) minutes as needed for chest pain.   Yes [provider]  Omega-3 Fatty Acids (FISH OIL) 1000 MG CAPS Take 1 capsule by mouth at bedtime.    Yes [provider]  potassium citrate (UROCIT-K) 10 MEQ (1080 MG) SR tablet Take 10 mEq by mouth 4 (four) times daily.    Yes [provider]  sertraline (ZOLOFT) 100 MG tablet Take 100 mg by mouth daily.   Yes [provider]  timolol (BETIMOL) 0.5 % ophthalmic solution Place 1 drop into both eyes 2 (two) times daily.    Yes [provider]    Family History Family History  Problem Relation Age of Onset  . Heart disease Mother   . Heart attack Mother   . Hypertension Mother   . High Cholesterol Mother   . Sudden death Mother   . Heart disease Father   . Diabetes Father   . Heart attack Father   . High Cholesterol Father   . Hypertension Father   . Sudden death Father   . Fainting Sister   . Hypertension Sister   . Heart attack Sister   . High Cholesterol Sister   . Heart disease Brother   . Diabetes Brother   . Heart attack Brother   . High Cholesterol Brother   . Stroke Brother     Social History Social History   Tobacco Use  . Smoking status: Current Every Day Smoker    Packs/day: 0.25    Types: Cigarettes  . Smokeless tobacco: Never Used  Substance Use Topics  . Alcohol use: Not on file  . Drug use: Not on file     Allergies   Azithromycin; Sulfa antibiotics; Bactrim [sulfamethoxazole-trimethoprim]; and Celebrex [celecoxib]   Review of Systems Review of Systems  Constitutional: Negative for fever.  HENT: Negative for congestion.   Eyes: Negative for redness.  Respiratory: Negative for shortness of breath.   Cardiovascular: Positive for chest pain. Negative for leg swelling.  Gastrointestinal: Positive for abdominal pain and nausea. Negative for vomiting.  Genitourinary: Negative for dysuria.  Musculoskeletal: Negative for back pain.  Neurological: Negative for syncope.  Hematological: Does not bruise/bleed easily.  Psychiatric/Behavioral: Negative for confusion.     Physical Exam Updated Vital Signs BP 119/81   Pulse 67    Resp 19   SpO2 96%   Physical Exam  Constitutional: She is oriented to person, place, and time. She appears well-developed and well-nourished. No distress.  HENT:  Head: Normocephalic and atraumatic.  Mouth/Throat: Oropharynx is clear and moist.  Eyes: Pupils are equal, round, and reactive to light. Conjunctivae and EOM are normal.  Neck: Neck supple.  Cardiovascular: Normal rate, regular rhythm and normal heart sounds.  Pulmonary/Chest: Effort normal and breath sounds normal. No respiratory distress.  Abdominal: Soft. Bowel sounds are normal. There is no tenderness.  Musculoskeletal: Normal range of motion. She  exhibits no edema.  Neurological: She is alert and oriented to person, place, and time. No cranial nerve deficit or sensory deficit. She exhibits normal muscle tone. Coordination normal.  Skin: Skin is warm.  Nursing note and vitals reviewed.    ED Treatments / Results  Labs (all labs ordered are listed, but only abnormal results are displayed) Labs Reviewed  BASIC METABOLIC PANEL - Abnormal; Notable for the following components:      Result Value   Potassium 3.4 (*)    Chloride 99 (*)    Glucose, Bld 121 (*)    Creatinine, Ser 1.17 (*)    GFR calc non Af Amer 46 (*)    GFR calc Af Amer 54 (*)    All other components within normal limits  CBG MONITORING, ED - Abnormal; Notable for the following components:   Glucose-Capillary 120 (*)    All other components within normal limits  CBC WITH DIFFERENTIAL/PLATELET  TROPONIN I  TROPONIN I    EKG EKG Interpretation  Date/Time:  Thursday Sep 16 2017 18:10:49 EDT Ventricular Rate:  72 PR Interval:  164 QRS Duration: 90 QT Interval:  406 QTC Calculation: 444 R Axis:   20 Text Interpretation:  Normal sinus rhythm RSR' or QR pattern in V1 suggests right ventricular conduction delay Cannot rule out Inferior infarct , age undetermined ST & T wave abnormality, consider anterior ischemia Abnormal ECG No previous ECGs  available Confirmed by Fredia Sorrow [id number]) on 09/16/2017 6:24:04 PM   Radiology Dg Chest 2 View  Result Date: 09/16/2017 CLINICAL DATA:  Hypertension and pain mid chest pain x2 days intermittently. EXAM: CHEST - 2 VIEW COMPARISON:  None. FINDINGS: The heart size and mediastinal contours are within normal limits. Mild aortic atherosclerosis at the arch. No aneurysm. Minimal bibasilar atelectasis. The visualized skeletal structures are unremarkable. IMPRESSION: No active cardiopulmonary disease. Electronically Signed   By: Ashley Royalty M.D.   On: 09/16/2017 18:33    Procedures Procedures (including critical care time)  Medications Ordered in ED Medications - No data to display   Initial Impression / Assessment and Plan / ED Course  I have reviewed the triage vital signs and the nursing notes.  Pertinent labs & imaging results that were available during my care of the patient were reviewed by me and considered in my medical decision making (see chart for details).    Work-up here today EKG without acute changes.  Troponins x2 were negative.  Based on the duration of the symptoms starting on Monday would expect an abnormal troponin by this point in time.  Patient chest x-ray negative oxygen saturations good hearts not tachycardic patient's basic labs without significant abnormalities.  Chest pain should be noncardiac in nature.  And less symptoms get significantly worse.  Patient will be discharged home for follow-up with cardiology.  They may want to do a stress test.  Patient will be given a run of Pepcid Prilosec did not help which she initially felt as if it was an indigestion type process.  Patient also recommended to follow-up with her primary care doctor.  There was no EKG for comparison and MUSE.     Final Clinical Impressions(s) / ED Diagnoses   Final diagnoses:  Precordial pain    ED Discharge Orders    None       Fredia Sorrow, MD 09/16/17 2320

## 2017-09-21 ENCOUNTER — Telehealth: Payer: Self-pay | Admitting: Cardiovascular Disease

## 2017-09-21 NOTE — Telephone Encounter (Signed)
Discussed ER visit with patient.  ER visit was felt to be non-cardiac per ED note.  Suggestion was made for her to f/u with cardiology as he may want to do a stress test.  Post hosp sched for 10/18/2017 with Estella Husk, PA in [place] office.  In the meantime, suggested that she follow up with her pmd as well for treatment of the indigestion or other non cardiac causes.  She verbalized understanding.  Please advise if stress test is warranted.

## 2017-09-21 NOTE — Telephone Encounter (Signed)
Patient states that she went to ER 09/17/2017 with CP. States that she was told she had indigestion . Was told to follow up with Cardiology. States that today she continues to have indigestion. Please call [phone].

## 2017-09-22 NOTE — Telephone Encounter (Signed)
I would not order any testing until she has had an evaluation by Gerrianne Scale PA-C.

## 2017-09-22 NOTE — Telephone Encounter (Signed)
Left message to return call 

## 2017-09-29 ENCOUNTER — Other Ambulatory Visit: Payer: Self-pay | Admitting: Cardiovascular Disease

## 2017-09-29 NOTE — Telephone Encounter (Signed)
Was put on Pepcid x 2 weeks.  Doing much better now, feels like it all may have been indigestion.  She will keep OV with ML for [place] though to make sure nothing cardiac going on.

## 2017-10-18 ENCOUNTER — Ambulatory Visit: Payer: Medicare Other | Admitting: Physician Assistant

## 2017-11-18 ENCOUNTER — Telehealth: Payer: Self-pay | Admitting: Cardiovascular Disease

## 2017-11-18 NOTE — Telephone Encounter (Signed)
Patient called about dose of  lisinopril (PRINIVIL,ZESTRIL) 20 MG tablet  Stated that she has been taking twice a day for years     [facility] [place]

## 2017-11-18 NOTE — Telephone Encounter (Signed)
No documentation of lisinopril 20 mg bid that I can find. LM for pt to return call

## 2017-11-18 NOTE — Telephone Encounter (Signed)
Pt says she has been taking lisinopril 20 mg bid for "years": and wants it refilled that way - will forward to provider if ok since not documented LOV

## 2017-11-19 MED ORDER — LISINOPRIL 20 MG PO TABS: 20.0000 mg | ORAL_TABLET | Freq: Two times a day (BID) | ORAL | 3 refills | Status: DC

## 2017-11-19 NOTE — Telephone Encounter (Signed)
Ok to fill as lisinopril 20mg  bid   Zandra Abts MD

## 2017-11-19 NOTE — Telephone Encounter (Signed)
Left msg notifying patient.

## 2018-01-21 ENCOUNTER — Other Ambulatory Visit: Payer: Self-pay | Admitting: Urology

## 2018-01-21 DIAGNOSIS — N200 Calculus of kidney: Secondary | ICD-10-CM

## 2018-05-17 ENCOUNTER — Ambulatory Visit (INDEPENDENT_AMBULATORY_CARE_PROVIDER_SITE_OTHER): Payer: Medicare Other | Admitting: Urology

## 2018-05-17 DIAGNOSIS — N200 Calculus of kidney: Secondary | ICD-10-CM

## 2018-05-30 ENCOUNTER — Ambulatory Visit (HOSPITAL_COMMUNITY)
Admission: RE | Admit: 2018-05-30 | Discharge: 2018-05-30 | Disposition: A | Discharge status: Home / Self Care | Payer: Medicare Other | Source: Ambulatory Visit | Attending: Urology | Admitting: Urology

## 2018-05-30 ENCOUNTER — Other Ambulatory Visit (HOSPITAL_COMMUNITY): Payer: Self-pay | Admitting: Urology

## 2018-05-30 DIAGNOSIS — N200 Calculus of kidney: Secondary | ICD-10-CM

## 2018-09-15 ENCOUNTER — Other Ambulatory Visit: Payer: Self-pay | Admitting: Cardiovascular Disease

## 2018-09-16 ENCOUNTER — Other Ambulatory Visit: Payer: Self-pay | Admitting: Cardiovascular Disease

## 2018-09-16 ENCOUNTER — Telehealth: Payer: Self-pay | Admitting: Cardiovascular Disease

## 2018-09-16 NOTE — Telephone Encounter (Signed)

## 2018-09-20 ENCOUNTER — Encounter: Payer: Self-pay | Admitting: Cardiovascular Disease

## 2018-09-20 ENCOUNTER — Telehealth: Payer: Medicare Other | Admitting: Cardiovascular Disease

## 2018-09-20 ENCOUNTER — Other Ambulatory Visit: Payer: Self-pay

## 2018-09-21 ENCOUNTER — Telehealth (INDEPENDENT_AMBULATORY_CARE_PROVIDER_SITE_OTHER): Payer: Medicare Other | Admitting: Cardiovascular Disease

## 2018-09-21 ENCOUNTER — Other Ambulatory Visit: Payer: Self-pay

## 2018-09-21 ENCOUNTER — Encounter: Payer: Self-pay | Admitting: Cardiovascular Disease

## 2018-09-21 VITALS — BP 97/69 | HR 74 | Ht 69.0 in | Wt 200.0 lb

## 2018-09-21 DIAGNOSIS — I10 Essential (primary) hypertension: Secondary | ICD-10-CM

## 2018-09-21 DIAGNOSIS — I25118 Atherosclerotic heart disease of native coronary artery with other forms of angina pectoris: Secondary | ICD-10-CM | POA: Diagnosis not present

## 2018-09-21 DIAGNOSIS — Z955 Presence of coronary angioplasty implant and graft: Secondary | ICD-10-CM

## 2018-09-21 DIAGNOSIS — E119 Type 2 diabetes mellitus without complications: Secondary | ICD-10-CM

## 2018-09-21 DIAGNOSIS — R7989 Other specified abnormal findings of blood chemistry: Secondary | ICD-10-CM

## 2018-09-21 DIAGNOSIS — Z716 Tobacco abuse counseling: Secondary | ICD-10-CM

## 2018-09-21 DIAGNOSIS — E785 Hyperlipidemia, unspecified: Secondary | ICD-10-CM

## 2018-09-21 MED ORDER — ATORVASTATIN CALCIUM 80 MG PO TABS: 80.0000 mg | ORAL_TABLET | Freq: Every day | ORAL | 3 refills | Status: DC

## 2018-09-21 NOTE — Addendum Note (Signed)
Addended by: Laurine Blazer on: 09/21/2018 09:31 AM   Modules accepted: Orders

## 2018-09-21 NOTE — Patient Instructions (Addendum)
Medication Instructions:   Increase Lipitor to 80mg  daily.  New prescription sent to pharmacy today.   Continue all other medications.    Labwork: none  Testing/Procedures: none  Follow-Up: Your physician wants you to follow up in:  1 year.  You will receive a reminder letter in the mail one-two months in advance.  If you don't receive a letter, please call our office to schedule the follow up appointment   Any Other Special Instructions Will Be Listed Below (If Applicable).  If you need a refill on your cardiac medications before your next appointment, please call your pharmacy.

## 2018-09-21 NOTE — Progress Notes (Signed)
Virtual Visit via Telephone Note   This visit type was conducted due to national recommendations for restrictions regarding the COVID-19 Pandemic (e.g. social distancing) in an effort to limit this patient's exposure and mitigate transmission in our community.  Due to her co-morbid illnesses, this patient is at least at moderate risk for complications without adequate follow up.  This format is felt to be most appropriate for this patient at this time.  The patient did not have access to video technology/had technical difficulties with video requiring transitioning to audio format only (telephone).  All issues noted in this document were discussed and addressed.  No physical exam could be performed with this format.  Please refer to the patient's chart for her  consent to telehealth for [facility].   Date:  09/21/2018   ID:  Sandy Meyer, DOB 16-Jun-1945, MRN [id number]  Patient Location: Home Provider Location: Office  PCP:  Wyatt Haste, NP  Cardiologist:  Kate Sable, MD  Electrophysiologist:  None   Evaluation Performed:  Follow-Up Visit  Chief Complaint:  CAD  History of Present Illness:    Sandy Meyer is a 73 y.o. female with coronary artery disease.  She had an LAD stent placed in 2001 (by Dr. Lissa Merlin in [place]   Has been staying home. She denies chest pain. She doesn't eat much meat. Does eat a lot of pasta. She denies any significant shortness of breath, lightheadedness, and dizziness.  Vit D low at 15. A1C elevated at 7%.  The patient does not have symptoms concerning for COVID-19 infection (fever, chills, cough, or new shortness of breath).   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    Past Medical History:  Diagnosis Date  . Anxiety   . Arthritis   . Blockage of coronary artery of heart ([place])   . CAD (coronary artery disease)   . Cervical cancer ([place])   . Coronary artery disease   . Depression   . Diabetes ([place])   . Dyslipidemia   .  Hyperlipidemia   . Hypertension   . Joint pain   . Kidney stones   . MI (myocardial infarction) ([place])   . Syncope    Past Surgical History:  Procedure Laterality Date  . CESAREAN SECTION    . complete hysterectomy    . REPLACEMENT TOTAL KNEE       Current Meds  Medication Sig  . amitriptyline (ELAVIL) 10 MG tablet Take 20 mg by mouth at bedtime.   [name] aspirin 81 MG chewable tablet Chew 81 mg by mouth every evening.   [name] atorvastatin (LIPITOR) 40 MG tablet TAKE 1 TABLET(40 MG) BY MOUTH DAILY  . cetirizine (ZYRTEC) 10 MG tablet Take 10 mg by mouth every evening.   [name] estradiol (VIVELLE-DOT) 0.05 MG/24HR patch Place 1 patch onto the skin every Sunday.   . ezetimibe (ZETIA) 10 MG tablet TAKE 1 TABLET(10 MG) BY MOUTH DAILY  . famotidine (PEPCID) 20 MG tablet Take 1 tablet (20 mg total) by mouth 2 (two) times daily.  [name] gabapentin (NEURONTIN) 300 MG capsule Take 300 mg by mouth 2 (two) times daily.  . hydrochlorothiazide (HYDRODIURIL) 25 MG tablet Take 1 tablet (25 mg total) by mouth 2 (two) times daily.  [name] HYDROmorphone (DILAUDID) 2 MG tablet Take by mouth every 4 (four) hours as needed for severe pain.  [name] latanoprost (XALATAN) 0.005 % ophthalmic solution Place 1 drop into both eyes at bedtime.   [name] LORazepam (ATIVAN) 1 MG tablet Take 2  mg by mouth at bedtime.   . methocarbamol (ROBAXIN) 500 MG tablet Take 500 mg by mouth at bedtime.  . nitroGLYCERIN (NITROSTAT) 0.4 MG SL tablet ONE TABLET UNDER TONGUE AS NEEDED FOR CHEST PAIN  . Omega-3 Fatty Acids (FISH OIL) 1000 MG CAPS Take 1 capsule by mouth at bedtime.   . potassium citrate (UROCIT-K) 10 MEQ (1080 MG) SR tablet Take 10 mEq by mouth 4 (four) times daily.   . sertraline (ZOLOFT) 100 MG tablet Take 100 mg by mouth daily.  . timolol (BETIMOL) 0.5 % ophthalmic solution Place 1 drop into both eyes 2 (two) times daily.      Allergies:   Azithromycin; Sulfa antibiotics; Bactrim [sulfamethoxazole-trimethoprim]; and Celebrex [celecoxib]    Social History   Tobacco Use  . Smoking status: Current Every Day Smoker    Packs/day: 0.25    Types: Cigarettes  . Smokeless tobacco: Never Used  Substance Use Topics  . Alcohol use: Not on file  . Drug use: Not on file     Family Hx: The patient's family history includes Diabetes in her brother and father; Fainting in her sister; Heart attack in her brother, father, mother, and sister; Heart disease in her brother, father, and mother; High Cholesterol in her brother, father, mother, and sister; Hypertension in her father, mother, and sister; Stroke in her brother; Sudden death in her father and mother.  ROS:   Please see the history of present illness.     All other systems reviewed and are negative.   Prior CV studies:   The following studies were reviewed today:  Nuclear stress test on 11/10/16 was normal.  Echocardiogram demonstrated normal left ventricular systolic function and regional wall motion, LVEF 60-65%. There was mild mitral regurgitation and grade indeterminate diastolic dysfunction.  Labs/Other Tests and Data Reviewed:    EKG:  No ECG reviewed.  Recent Labs: No results found for requested labs within last 8760 hours.   Recent Lipid Panel No results found for: CHOL, TRIG, HDL, CHOLHDL, LDLCALC, LDLDIRECT  Wt Readings from Last 3 Encounters:  09/21/18 200 lb (90.7 kg)  09/03/17 208 lb (94.3 kg)  12/03/16 204 lb 12.8 oz (92.9 kg)     Objective:    Vital Signs:  BP 97/69   Pulse 74   Ht 5\' 9"  (1.753 m)   Wt 200 lb (90.7 kg)   BMI 29.53 kg/m    VITAL SIGNS:  reviewed  ASSESSMENT & PLAN:    1. Coronary artery disease status post LAD stent in 2001: Stable ischemic heart disease. Normal stress test and normal LV systolic function by echocardiogram. Continue aspirin and Lipitor.  I will increase Lipitor to 80 mg.  2. Hypertension: BP is low normal.  She is asymptomatic.  No changes.  3. Hyperlipidemia: Lipids on 08/26/18 showed TC 180, TG 420,  HDL 38, LDL 86.  I will increase Lipitor to 80 mg.  Continue Zetia 10 mg.  4. Tobacco abuse:Continues to smoke a half pack daily. Cessationcounseling again provided.  5.  Type 2 diabetes mellitus: HbA1c 7% consistent with a diagnosis of type 2 diabetes mellitus.  She will need medical therapy which will be deferred to her PCP.  6.  Low vitamin D level: She has been restarted on oral vitamin D supplementation by her PCP.   COVID-19 Education: The signs and symptoms of COVID-19 were discussed with the patient and how to seek care for testing (follow up with PCP or arrange E-visit).  The importance of  social distancing was discussed today.  Time:   Today, I have spent 25 minutes with the patient with telehealth technology discussing the above problems.     Medication Adjustments/Labs and Tests Ordered: Current medicines are reviewed at length with the patient today.  Concerns regarding medicines are outlined above.   Tests Ordered: No orders of the defined types were placed in this encounter.   Medication Changes: No orders of the defined types were placed in this encounter.   Disposition:  Follow up in 1 year(s)  Signed, Kate Sable, MD  09/21/2018 8:57 AM    [facility]

## 2018-09-22 ENCOUNTER — Other Ambulatory Visit: Payer: Self-pay | Admitting: Cardiology

## 2018-12-11 ENCOUNTER — Other Ambulatory Visit: Payer: Self-pay | Admitting: Cardiovascular Disease

## 2019-01-25 ENCOUNTER — Other Ambulatory Visit: Payer: Self-pay | Admitting: *Deleted

## 2019-01-25 MED ORDER — LISINOPRIL 20 MG PO TABS: ORAL_TABLET | ORAL | 2 refills | Status: DC

## 2019-03-21 ENCOUNTER — Other Ambulatory Visit: Payer: Self-pay | Admitting: Cardiovascular Disease

## 2019-04-18 ENCOUNTER — Encounter: Payer: Self-pay | Admitting: Urology

## 2019-04-25 ENCOUNTER — Encounter: Payer: Self-pay | Admitting: Urology

## 2019-06-08 ENCOUNTER — Other Ambulatory Visit: Payer: Self-pay | Admitting: *Deleted

## 2019-06-08 MED ORDER — EZETIMIBE 10 MG PO TABS: 10.0000 mg | ORAL_TABLET | Freq: Every day | ORAL | 2 refills | Status: DC

## 2019-06-28 ENCOUNTER — Telehealth: Payer: Self-pay | Admitting: Cardiovascular Disease

## 2019-06-28 ENCOUNTER — Other Ambulatory Visit: Payer: Self-pay

## 2019-06-28 ENCOUNTER — Telehealth: Payer: Self-pay | Admitting: Urology

## 2019-06-28 DIAGNOSIS — N200 Calculus of kidney: Secondary | ICD-10-CM

## 2019-06-28 MED ORDER — HYDROCHLOROTHIAZIDE 25 MG PO TABS: 25.0000 mg | ORAL_TABLET | Freq: Two times a day (BID) | ORAL | 1 refills | Status: DC

## 2019-06-28 MED ORDER — ATORVASTATIN CALCIUM 80 MG PO TABS: 80.0000 mg | ORAL_TABLET | Freq: Every day | ORAL | 1 refills | Status: DC

## 2019-06-28 MED ORDER — POTASSIUM CITRATE ER 10 MEQ (1080 MG) PO TBCR: 10.0000 meq | EXTENDED_RELEASE_TABLET | Freq: Four times a day (QID) | ORAL | 2 refills | Status: DC

## 2019-06-28 MED ORDER — LISINOPRIL 20 MG PO TABS: ORAL_TABLET | ORAL | 1 refills | Status: DC

## 2019-06-28 NOTE — Telephone Encounter (Signed)
Patient called very upset and states she needs refills on medications and has been urinating very little. She asks for a nurse to call her back.

## 2019-06-28 NOTE — Telephone Encounter (Signed)
Medication sent to pharmacy  

## 2019-06-28 NOTE — Telephone Encounter (Signed)
*  STAT* If patient is at the pharmacy, call can be transferred to refill team.   1. Which medications need to be refilled? Please send all medication that Dr Bronson Ing fills   - she is out of all   2. Which pharmacy/location (including street and city if local pharmacy) is medication to be sent to? [facility] [place]  3. Do they need a 30 day or 90 day supply?

## 2019-06-28 NOTE — Telephone Encounter (Signed)
I spoke with pt. Needed refill of [facility]rocit K [facility] to [facility]. Med refill sent.

## 2019-07-24 ENCOUNTER — Telehealth: Payer: Self-pay | Admitting: Cardiovascular Disease

## 2019-07-24 MED ORDER — EZETIMIBE 10 MG PO TABS: 10.0000 mg | ORAL_TABLET | Freq: Every day | ORAL | 0 refills | Status: AC

## 2019-07-24 NOTE — Telephone Encounter (Signed)
*  STAT* If patient is at the pharmacy, call can be transferred to refill team.   1. Which medications need to be refilled?  ezetimibe (ZETIA) 10 MG tablet    2. Which pharmacy/location (including street and city if local pharmacy) is medication to be sent to? [place] 3. Do they need a 30 day or 90 day supply? [place]

## 2019-08-08 ENCOUNTER — Ambulatory Visit (INDEPENDENT_AMBULATORY_CARE_PROVIDER_SITE_OTHER): Payer: Medicare Other | Admitting: Urology

## 2019-08-08 ENCOUNTER — Encounter: Payer: Self-pay | Admitting: Urology

## 2019-08-08 ENCOUNTER — Other Ambulatory Visit: Payer: Self-pay

## 2019-08-08 VITALS — BP 94/54 | HR 76 | Temp 98.7°F | Ht 69.0 in | Wt 195.0 lb

## 2019-08-08 DIAGNOSIS — N200 Calculus of kidney: Secondary | ICD-10-CM | POA: Diagnosis not present

## 2019-08-08 LAB — POCT URINALYSIS DIPSTICK
Bilirubin, UA: NEGATIVE
Blood, UA: NEGATIVE
Glucose, UA: NEGATIVE
Ketones, UA: NEGATIVE
Nitrite, UA: NEGATIVE
Protein, UA: NEGATIVE
Spec Grav, UA: 1.015 (ref 1.010–1.025)
Urobilinogen, UA: 0.2 E.U./dL
pH, UA: 7.5 (ref 5.0–8.0)

## 2019-08-08 MED ORDER — POTASSIUM CITRATE ER 10 MEQ (1080 MG) PO TBCR: 10.0000 meq | EXTENDED_RELEASE_TABLET | Freq: Four times a day (QID) | ORAL | 3 refills | Status: DC

## 2019-08-08 NOTE — Progress Notes (Signed)
Urological Symptom Review  Patient is experiencing the following symptoms: Leakage of urine Kidney stones  Review of Systems  Gastrointestinal (upper)  : Negative for upper GI symptoms  Gastrointestinal (lower) : Negative for lower GI symptoms  Constitutional : Negative for symptoms  Skin: Negative for skin symptoms  Eyes: Negative for eye symptoms  Ear/Nose/Throat : Sinus problems  Hematologic/Lymphatic: Negative for Hematologic/Lymphatic symptoms  Cardiovascular : Leg swelling  Respiratory : Negative for respiratory symptoms  Endocrine: Negative for endocrine symptoms  Musculoskeletal: Back pain Joint pain  Neurological: Negative for neurological symptoms  Psychologic: Anxiety

## 2019-08-08 NOTE — Progress Notes (Signed)
H&P  Chief Complaint: Kidney Stones  History of Present Illness:  4.13.2021: Sandy Meyer is a 74 y.o. year old female returning today for follow-up. She denies having had any issues with stones since last visit and continues on potassium citrate. She also reports having had a very high water intake alongside coffee and cranberry juice (no soda or other high sugar drinks). She has continued smoking.   (below copied from [facility] records):  Sandy Meyer is a 74 year-old female established patient who is here for renal calculi.  The problem is on both sides. This is not her first kidney stone. She has had more than 5 stones prior to getting this one. She is not currently having flank pain, back pain, groin pain, nausea, vomiting, fever or chills. She has not caught a stone in her urine strainer since her symptoms began.   She has a history of medullary sponge kidney and treatment for several stones in the past. Most of the time, she has needed ureteroscopy and stone extraction. Her last treatment was quite a few years ago. She states that she has had > 5 interventions for kidney stones.   She has hypercalciuria and hypocitraturia. She is maintained on potassium citrate 10 mEq 4 times a day as well as hydrochlorothiazide 25 mg twice a day. She last had imaging for her kidneys in 2015 by urologists in [place], [place]. She has had no recent flank pain or gross hematuria.   3.19.2019: 24 hour urine showed low volume 1.8L, low citrate at 375, low magnesium normal calcium.   1.21.2020: She is still on potassium citrate 40 mEq a day. She does have mild GI upset from that. She hasn't passed any stones over the past year.   Past Medical History:  Diagnosis Date  . Anxiety   . Arthritis   . Blockage of coronary artery of heart ([place])   . CAD (coronary artery disease)   . Cervical cancer ([place])   . Coronary artery disease   . Depression   . Diabetes ([place])   . Dyslipidemia   . Hyperlipidemia    . Hypertension   . Joint pain   . Kidney stones   . MI (myocardial infarction) ([place])   . Syncope     Past Surgical History:  Procedure Laterality Date  . CESAREAN SECTION    . complete hysterectomy    . REPLACEMENT TOTAL KNEE      Home Medications:  (Not in a hospital admission)   Allergies:  Allergies  Allergen Reactions  . Azithromycin Other (See Comments)    unknown  . Sulfa Antibiotics Hives and Swelling  . Bactrim [Sulfamethoxazole-Trimethoprim] Hives, Swelling and Rash  . Celebrex [Celecoxib] Rash    Family History  Problem Relation Age of Onset  . Heart disease Mother   . Heart attack Mother   . Hypertension Mother   . High Cholesterol Mother   . Sudden death Mother   . Heart disease Father   . Diabetes Father   . Heart attack Father   . High Cholesterol Father   . Hypertension Father   . Sudden death Father   . Fainting Sister   . Hypertension Sister   . Heart attack Sister   . High Cholesterol Sister   . Heart disease Brother   . Diabetes Brother   . Heart attack Brother   . High Cholesterol Brother   . Stroke Brother     Social History:  reports that she has been smoking  cigarettes. She has been smoking about 0.25 packs per day. She has never used smokeless tobacco. No history on file for alcohol and drug.  ROS: A complete review of systems was performed.  All systems are negative except for pertinent findings as noted.  Physical Exam:  Vital signs in last 24 hours: Temp:  [98.7 F (37.1 C)] 98.7 F (37.1 C) (04/13 1535) Pulse Rate:  [76] 76 (04/13 1535) BP: (94)/(54) 94/54 (04/13 1535) Weight:  [195 lb (88.5 kg)] 195 lb (88.5 kg) (04/13 1535) General:  Alert and oriented, No acute distress HEENT: Normocephalic, atraumatic Neck: No JVD or lymphadenopathy Cardiovascular: Regular rate  Lungs: Normal inspiratory/expiratory excursion Abdomen: Soft, nontender, nondistended, no abdominal masses Back: No CVA tenderness Extremities: No  edema Neurologic: Grossly intact  Laboratory Data:  Results for orders placed or performed in visit on 08/08/19 (from the past 24 hour(s))  POCT urinalysis dipstick     Status: Abnormal   Collection Time: 08/08/19  3:38 PM  Result Value Ref Range   Color, UA yellow    Clarity, UA     Glucose, UA Negative Negative   Bilirubin, UA neg    Ketones, UA neg    Spec Grav, UA 1.015 1.010 - 1.025   Blood, UA neg    pH, UA 7.5 5.0 - 8.0   Protein, UA Negative Negative   Urobilinogen, UA 0.2 0.2 or 1.0 E.U./dL   Nitrite, UA neg    Leukocytes, UA Small (1+) (A) Negative   Appearance clear    Odor     I have reviewed prior pt notes  I have reviewed notes from referring/previous physicians  I have reviewed urinalysis results  I have independently reviewed prior imaging   Impression/Assessment:  She has been very compliant with potassium citrate -- urine pH is 7.5 despite not having taken it today. No stone episodes in interval. Will recheck KUB to track stones.   Plan:  1. Continue on potassium citrate  2. Will recheck a KUB and follow-up with results  3. Return in 1 yr for follow-up.   4. She was advised that cranberry juice is likely not having much of a positive effect if any on her stones and can stop this if desired.  Dena Billet 08/08/2019, 4:09 PM  Lillette Boxer. Dahlstedt MD

## 2019-09-15 ENCOUNTER — Encounter: Payer: Self-pay | Admitting: Cardiovascular Disease

## 2019-09-15 ENCOUNTER — Other Ambulatory Visit: Payer: Self-pay

## 2019-09-15 ENCOUNTER — Ambulatory Visit (INDEPENDENT_AMBULATORY_CARE_PROVIDER_SITE_OTHER): Payer: Medicare Other | Admitting: Cardiovascular Disease

## 2019-09-15 VITALS — BP 84/60 | HR 84 | Ht 69.0 in | Wt 194.4 lb

## 2019-09-15 DIAGNOSIS — Z955 Presence of coronary angioplasty implant and graft: Secondary | ICD-10-CM | POA: Diagnosis not present

## 2019-09-15 DIAGNOSIS — I25118 Atherosclerotic heart disease of native coronary artery with other forms of angina pectoris: Secondary | ICD-10-CM | POA: Diagnosis not present

## 2019-09-15 DIAGNOSIS — I10 Essential (primary) hypertension: Secondary | ICD-10-CM

## 2019-09-15 DIAGNOSIS — E785 Hyperlipidemia, unspecified: Secondary | ICD-10-CM

## 2019-09-15 DIAGNOSIS — E781 Pure hyperglyceridemia: Secondary | ICD-10-CM

## 2019-09-15 DIAGNOSIS — Z720 Tobacco use: Secondary | ICD-10-CM

## 2019-09-15 MED ORDER — ICOSAPENT ETHYL 1 G PO CAPS
2.0000 g | ORAL_CAPSULE | Freq: Two times a day (BID) | ORAL | 6 refills | Status: DC
Start: 2019-09-15 — End: 2019-10-05

## 2019-09-15 NOTE — Patient Instructions (Addendum)
Medication Instructions:   Stop Lisinopril.  Begin Vascepa 2gm twice a day.   Continue all other medications.    Labwork: none  Testing/Procedures: none  Follow-Up: 6 months   Any Other Special Instructions Will Be Listed Below (If Applicable).  If you need a refill on your cardiac medications before your next appointment, please call your pharmacy.

## 2019-09-15 NOTE — Progress Notes (Signed)
SUBJECTIVE: Sandy Meyer is a 74 y.o. female with coronary artery disease. She had an LAD stent placed in 2001 (by Dr. Lissa Merlin in [place]  She denies chest pain, palpitations, leg swelling, shortness of breath.  Her chihuahua died yesterday and was only a year old.  Her friend in my patient, Guilford Shi, passed away on New Year's Eve.  She has felt fatigued but is not sure if this is just due to depression.  Her blood pressure has been low and she has held the evening dose of lisinopril several days last week.  She is hypotensive today.  She denies dizziness and syncope.  I personally viewed ECG performed today which demonstrates sinus rhythm with RSR prime pattern.  I reviewed labs which she brought in.  LDL was at goal at 60.  Triglycerides were mildly elevated at 235.  She said her heart rate has gotten into the 40 bpm range in the evenings on at least 2 occasions but she was asymptomatic.   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   Review of Systems: As per "subjective", otherwise negative.  Allergies  Allergen Reactions  . Azithromycin Other (See Comments)    unknown  . Sulfa Antibiotics Hives and Swelling  . Bactrim [Sulfamethoxazole-Trimethoprim] Hives, Swelling and Rash  . Celebrex [Celecoxib] Rash    Current Outpatient Medications  Medication Sig Dispense Refill  . amitriptyline (ELAVIL) 10 MG tablet Take 20 mg by mouth at bedtime.     [name] aspirin 81 MG chewable tablet Chew 81 mg by mouth every evening.     [name] atorvastatin (LIPITOR) 80 MG tablet Take 1 tablet (80 mg total) by mouth daily. 90 tablet 1  . estradiol (VIVELLE-DOT) 0.05 MG/24HR patch Place 1 patch onto the skin every Sunday.     . ezetimibe (ZETIA) 10 MG tablet Take 1 tablet (10 mg total) by mouth daily. 90 tablet 0  . famotidine (PEPCID) 20 MG tablet Take 1 tablet (20 mg total) by mouth 2 (two) times daily. 30 tablet 0  . gabapentin (NEURONTIN) 300 MG capsule Take 300 mg by mouth 2 (two)  times daily.  2  . hydrochlorothiazide (HYDRODIURIL) 25 MG tablet Take 1 tablet (25 mg total) by mouth 2 (two) times daily. 180 tablet 1  . HYDROmorphone (DILAUDID) 2 MG tablet Take by mouth every 4 (four) hours as needed for severe pain.    [name] latanoprost (XALATAN) 0.005 % ophthalmic solution Place 1 drop into both eyes at bedtime.     [name] lisinopril (ZESTRIL) 20 MG tablet TAKE 1 TABLET(20 MG) BY MOUTH TWICE DAILY 180 tablet 1  . LORazepam (ATIVAN) 1 MG tablet Take 2 mg by mouth at bedtime.     . nitroGLYCERIN (NITROSTAT) 0.4 MG SL tablet ONE TABLET UNDER TONGUE AS NEEDED FOR CHEST PAIN 25 tablet 3  . Omega-3 Fatty Acids (FISH OIL) 1000 MG CAPS Take 1 capsule by mouth at bedtime.     . potassium citrate (UROCIT-K) 10 MEQ (1080 MG) SR tablet Take 1 tablet (10 mEq total) by mouth 4 (four) times daily. 180 tablet 3  . sertraline (ZOLOFT) 100 MG tablet Take 100 mg by mouth daily.    . timolol (BETIMOL) 0.5 % ophthalmic solution Place 1 drop into both eyes daily.      No current facility-administered medications for this visit.    Past Medical History:  Diagnosis Date  . Anxiety   . Arthritis   . Blockage of coronary artery of heart (  [place])   . CAD (coronary artery disease)   . Cervical cancer ([place])   . Coronary artery disease   . Depression   . Diabetes ([place])   . Dyslipidemia   . Hyperlipidemia   . Hypertension   . Joint pain   . Kidney stones   . MI (myocardial infarction) ([place])   . Syncope     Past Surgical History:  Procedure Laterality Date  . CESAREAN SECTION    . complete hysterectomy    . REPLACEMENT TOTAL KNEE      Social History   Socioeconomic History  . Marital status: Divorced    Spouse name: Not on file  . Number of children: Not on file  . Years of education: Not on file  . Highest education level: Not on file  Occupational History  . Not on file  Tobacco Use  . Smoking status: Current Every Day Smoker    Packs/day: 0.25    Types: Cigarettes    Start date:  02/17/1961  . Smokeless tobacco: Never Used  Substance and Sexual Activity  . Alcohol use: Not on file  . Drug use: Not on file  . Sexual activity: Not on file  Other Topics Concern  . Not on file  Social History Narrative  . Not on file   Social Determinants of Health   Financial Resource Strain:   . Difficulty of Paying Living Expenses:   Food Insecurity:   . Worried About Charity fundraiser in the Last Year:   . Arboriculturist in the Last Year:   Transportation Needs:   . Film/video editor (Medical):   [name] Lack of Transportation (Non-Medical):   Physical Activity:   . Days of Exercise per Week:   . Minutes of Exercise per Session:   Stress:   . Feeling of Stress :   Social Connections:   . Frequency of Communication with Friends and Family:   . Frequency of Social Gatherings with Friends and Family:   . Attends Religious Services:   . Active Member of Clubs or Organizations:   . Attends Archivist Meetings:   [name] Marital Status:   Intimate Partner Violence:   . Fear of Current or Ex-Partner:   . Emotionally Abused:   [name] Physically Abused:   . Sexually Abused:     Orson Slick, LPN was present throughout the entirety of the encounter.  Vitals:   09/15/19 1449  BP: (!) 84/60  Pulse: 84  SpO2: 96%  Weight: 194 lb 6.4 oz (88.2 kg)  Height: 5\' 9"  (1.753 m)    Wt Readings from Last 3 Encounters:  09/15/19 194 lb 6.4 oz (88.2 kg)  08/08/19 195 lb (88.5 kg)  09/21/18 200 lb (90.7 kg)     PHYSICAL EXAM General: NAD HEENT: Normal. Neck: No JVD, no thyromegaly. Lungs: Clear to auscultation bilaterally with normal respiratory effort. CV: Regular rate and rhythm, normal S1/S2, no S3/S4, no murmur. No pretibial or periankle edema.  No carotid bruit.   Abdomen: Soft, nontender, no distention.  Neurologic: Alert and oriented.  Psych: Normal affect. Skin: Normal. Musculoskeletal: No gross deformities.      Labs: Lab Results  Component Value  Date/Time   K 3.4 (L) 09/16/2017 06:50 PM   BUN 15 09/16/2017 06:50 PM   CREATININE 1.17 (H) 09/16/2017 06:50 PM   HGB 15.0 09/16/2017 06:50 PM     Lipids: No results found for: LDLCALC, LDLDIRECT, CHOL, TRIG, HDL  Prior CV studies:   The following studies were reviewed today:  Nuclear stress test on 11/10/16 was normal.  Echocardiogram demonstrated normal left ventricular systolic function and regional wall motion, LVEF 60-65%. There was mild mitral regurgitation and grade indeterminate diastolic dysfunction.    ASSESSMENT AND PLAN:  1. Coronary artery disease status post LAD stent in 2001:Stable ischemic heart disease. Normal stress test and normal LV systolic function by echocardiogram. Continue aspirin and Lipitor.   I will add Vascepa given elevated triglycerides.  2. Hypertension: BP is low.  I will stop lisinopril.  3. Hyperlipidemia:Goal LDL<70.   LDL is 60.  Triglycerides are mildly elevated at 235.  I will start Vascepa 2 g twice daily.  Continue atorvastatin 80 mg and Zetia 10 mg.  4. Tobacco abuse:Continues to smoke a half pack daily. Cessationcounseling previously provided.   Disposition: Follow up virtual visit 6 months  Kate Sable, M.D., F.A.C.C.

## 2019-10-05 ENCOUNTER — Other Ambulatory Visit: Payer: Self-pay | Admitting: *Deleted

## 2019-10-05 ENCOUNTER — Telehealth: Payer: Self-pay | Admitting: *Deleted

## 2019-10-05 MED ORDER — ICOSAPENT ETHYL 1 G PO CAPS: 2.0000 g | ORAL_CAPSULE | Freq: Two times a day (BID) | ORAL | 0 refills | Status: DC

## 2019-10-05 NOTE — Telephone Encounter (Signed)
Reports having palpitations this morning. BP was 134/92 & HR 83. Reports taking lisinopril 20 mg and she felt better afterwards. Denies having caffeine prior to palpitations. Says she was taken off lisinopril by Dr. Raliegh Ip d/t low BP. Advised that she should probably take half the dose and continue monitoring her BP. Verbalized understanding.

## 2019-10-05 NOTE — Telephone Encounter (Signed)
Reports having palpitations this morning. BP was 134/92 & HR 83. Reports taking lisinopril 20 mg and she felt better afterwards. Denies having caffeine prior to palpitations. Says she was taken off lisinopril d/t low BP. Advised that she should probably take half the dose and continue monitoring her BP.   Also request samples of vascepa d/t not having insurance an unable to afford rx. Samples provided. No coupons or patient assistance available.

## 2019-10-06 NOTE — Telephone Encounter (Signed)
Patient informed and verbalized understanding of plan. 

## 2019-10-06 NOTE — Telephone Encounter (Signed)
Lisinopril would not have any affect on palpitations, only blood pressure. I would stay off the lisinopril for now, monitor bp at home once daily over the weekend and update [place] Monday on bp's. If ongoing palpitations we may need to consider a heart monitor   Zandra Abts MD

## 2019-11-28 ENCOUNTER — Encounter: Payer: Self-pay | Admitting: Family Medicine

## 2019-11-28 ENCOUNTER — Ambulatory Visit (INDEPENDENT_AMBULATORY_CARE_PROVIDER_SITE_OTHER): Payer: Medicare Other | Admitting: Family Medicine

## 2019-11-28 VITALS — BP 122/78 | HR 78 | Ht 69.0 in | Wt 197.0 lb

## 2019-11-28 DIAGNOSIS — Z720 Tobacco use: Secondary | ICD-10-CM

## 2019-11-28 DIAGNOSIS — I2510 Atherosclerotic heart disease of native coronary artery without angina pectoris: Secondary | ICD-10-CM | POA: Diagnosis not present

## 2019-11-28 DIAGNOSIS — I10 Essential (primary) hypertension: Secondary | ICD-10-CM | POA: Diagnosis not present

## 2019-11-28 DIAGNOSIS — E785 Hyperlipidemia, unspecified: Secondary | ICD-10-CM | POA: Diagnosis not present

## 2019-11-28 MED ORDER — ISOSORBIDE MONONITRATE ER 30 MG PO TB24: 15.0000 mg | ORAL_TABLET | Freq: Every day | ORAL | 6 refills | Status: DC

## 2019-11-28 NOTE — Progress Notes (Signed)
Cardiology Office Note  Date: 11/29/2019   ID: Paz Fuentes, DOB July 09, 1945, MRN [id number]  PCP:  Dara Lords, NP  Cardiologist:  No primary care provider on file. Electrophysiologist:  None   Chief Complaint: Follow-up CAD  History of Present Illness: Trystyn Sitts is a 74 y.o. female with a history of CAD with previous LAD stent placed in 2001, HTN, HLD, tobacco abuse.  Last office visit with Dr. Bronson Ing 09/15/2019.  She denied any chest pain, palpitations, dyspnea or lower extremity edema.  She felt fatigued but was sure it was due to depression due to the recent death of her mother and her pet.  She was hypotensive and had held the evening dose of her lisinopril for several days to prior week.  She was hypertensive during clinic visit but denied dizziness or syncope.  She stated her heart rate had gotten into the 40 bpm range in the evenings on at least 2 occasions but she was asymptomatic.  She presents today with some complaints of left arm pain.  She states she has had one episode recently .She denies specific chest pain or shoulder pain as she had when she had prior stent in 2001.  She denies any specific anginal or exertional symptoms. She has been walking up to 16 miles per week without symptoms. No orthostatic symptoms, palpitations, CVA or TIA like symptoms, PND, orthopnea. She states blood pressures have been fluctuating. She states sometimes the SBP can be in the mid 90's and as high as the 140's. After questioning she states SBP has been averaging in the 120's to 130's. She denies any exertional angina or use of SL NTG. No complaints or orthostatic symptoms, palpitations, or arrhythmias, PND, orthopnea, CVA or TIA like symptoms, claudication like symptoms or LE edema.  Past Medical History:  Diagnosis Date  . Anxiety   . Arthritis   . Blockage of coronary artery of heart ([place])   . CAD (coronary artery disease)   . Cervical cancer ([place])   . Coronary artery  disease   . Depression   . Diabetes ([place])   . Dyslipidemia   . Hyperlipidemia   . Hypertension   . Joint pain   . Kidney stones   . MI (myocardial infarction) ([place])   . Syncope     Past Surgical History:  Procedure Laterality Date  . CESAREAN SECTION    . complete hysterectomy    . REPLACEMENT TOTAL KNEE      Current Outpatient Medications  Medication Sig Dispense Refill  . amitriptyline (ELAVIL) 10 MG tablet Take 20 mg by mouth at bedtime.     [name] aspirin 81 MG chewable tablet Chew 81 mg by mouth every evening.     [name] atorvastatin (LIPITOR) 80 MG tablet Take 1 tablet (80 mg total) by mouth daily. 90 tablet 1  . estradiol (VIVELLE-DOT) 0.05 MG/24HR patch Place 1 patch onto the skin every Sunday.     . ezetimibe (ZETIA) 10 MG tablet Take 1 tablet (10 mg total) by mouth daily. 90 tablet 0  . famotidine (PEPCID) 20 MG tablet Take 1 tablet (20 mg total) by mouth 2 (two) times daily. 30 tablet 0  . gabapentin (NEURONTIN) 300 MG capsule Take 300 mg by mouth 2 (two) times daily.  2  . hydrochlorothiazide (HYDRODIURIL) 25 MG tablet Take 1 tablet (25 mg total) by mouth 2 (two) times daily. 180 tablet 1  . HYDROmorphone (DILAUDID) 2 MG tablet Take by mouth every 4 (four) hours  as needed for severe pain.    [name] icosapent Ethyl (VASCEPA) 1 g capsule Take 2 capsules (2 g total) by mouth 2 (two) times daily. 32 capsule 0  . latanoprost (XALATAN) 0.005 % ophthalmic solution Place 1 drop into both eyes at bedtime.     [name] LORazepam (ATIVAN) 1 MG tablet Take 2 mg by mouth at bedtime.     . nitroGLYCERIN (NITROSTAT) 0.4 MG SL tablet ONE TABLET UNDER TONGUE AS NEEDED FOR CHEST PAIN 25 tablet 3  . Omega-3 Fatty Acids (FISH OIL) 1000 MG CAPS Take 1 capsule by mouth at bedtime.     . potassium citrate (UROCIT-K) 10 MEQ (1080 MG) SR tablet Take 1 tablet (10 mEq total) by mouth 4 (four) times daily. 180 tablet 3  . sertraline (ZOLOFT) 100 MG tablet Take 100 mg by mouth daily.    . timolol (BETIMOL) 0.5 %  ophthalmic solution Place 1 drop into both eyes daily.     . isosorbide mononitrate (IMDUR) 30 MG 24 hr tablet Take 0.5 tablets (15 mg total) by mouth daily. 15 tablet 6   No current facility-administered medications for this visit.   Allergies:  Azithromycin, Sulfa antibiotics, Bactrim [sulfamethoxazole-trimethoprim], and Celebrex [celecoxib]   Social History: The patient  reports that she has been smoking cigarettes. She started smoking about 58 years ago. She has been smoking about 0.25 packs per day. She has never used smokeless tobacco.   Family History: The patient's family history includes Diabetes in her brother and father; Fainting in her sister; Heart attack in her brother, father, mother, and sister; Heart disease in her brother, father, and mother; High Cholesterol in her brother, father, mother, and sister; Hypertension in her father, mother, and sister; Stroke in her brother; Sudden death in her father and mother.   ROS:  Please see the history of present illness. Otherwise, complete review of systems is positive for none.  All other systems are reviewed and negative.   Physical Exam: VS:  BP 122/78   Pulse 78   Ht 5\' 9"  (1.753 m)   Wt 197 lb (89.4 kg)   SpO2 98%   BMI 29.09 kg/m , BMI Body mass index is 29.09 kg/m.  Wt Readings from Last 3 Encounters:  11/28/19 197 lb (89.4 kg)  09/15/19 194 lb 6.4 oz (88.2 kg)  08/08/19 195 lb (88.5 kg)    General: Patient appears comfortable at rest. Neck: Supple, no elevated JVP or carotid bruits, no thyromegaly. Lungs: Clear to auscultation, nonlabored breathing at rest. Cardiac: Regular rate and rhythm, no S3 or significant systolic murmur, no pericardial rub. Extremities: No pitting edema, distal pulses 2+. Skin: Warm and dry. Musculoskeletal: No kyphosis. Neuropsychiatric: Alert and oriented x3, affect grossly appropriate.  ECG:  EKG 09/15/2019 NSR RSR' VI , V2, suggesting RV conduction delay  Recent Labwork: No results  found for requested labs within last 8760 hours.  No results found for: CHOL, TRIG, HDL, CHOLHDL, VLDL, LDLCALC, LDLDIRECT  Other Studies Reviewed Today:  Nuclear stress test on 11/10/16 was normal.  Echocardiogram demonstrated normal left ventricular systolic function and regional wall motion, LVEF 60-65%. There was mild mitral regurgitation and grade indeterminate diastolic dysfunction.  Assessment and Plan:    1. CAD in native artery   2. Essential hypertension   3. Hyperlipidemia LDL goal <70    1. CAD in native artery Previous stenting of LAD 2001. Having recent left arm pain x 1 episode described "as if someone took a wire and inserted  it down my left arm". Not associated with exertion. No radiation or associated symptoms. Start Imdur 15 mg daily. Continue ASA 81 mg daily, NTG SL prn.  2. Essential hypertension She states on her home blood pressure monitors her blood pressures have been fluctuating. She states sometimes the systolic blood pressures have been in the mid 90's and as high as the 140's. After further questioning she admits he SBP usually averages in the 120's to 130's. Continue HCTZ daily.  3. Hyperlipidemia LDL goal <70 Lipid panel 07/06/2019 TG 235, TC 126, HDL 37, LDL 60. Continue Zetia 10 mg daily , Atorvastatin 80 mg daily.  Medication Adjustments/Labs and Tests Ordered: Current medicines are reviewed at length with the patient today.  Concerns regarding medicines are outlined above.   Disposition: Follow-up with Dr Harl Bowie or APP 6 months  Signed, Levell July, NP 11/29/2019 11:34 AM    [facility] [place], [place], [place] [address] Phone: [phone]; Fax: [phone]

## 2019-11-28 NOTE — Patient Instructions (Signed)
Medication Instructions:   Begin Imdur 15mg  daily (will have to take 1/2 tab of the 30mg  tablet).  Continue all other medications.    Labwork: none  Testing/Procedures: none  Follow-Up: 6 months   Any Other Special Instructions Will Be Listed Below (If Applicable).  If you need a refill on your cardiac medications before your next appointment, please call your pharmacy.

## 2019-11-29 ENCOUNTER — Encounter: Payer: Self-pay | Admitting: Family Medicine

## 2019-12-27 ENCOUNTER — Other Ambulatory Visit: Payer: Self-pay | Admitting: Family Medicine

## 2020-03-12 ENCOUNTER — Other Ambulatory Visit: Payer: Self-pay

## 2020-03-12 ENCOUNTER — Encounter (HOSPITAL_COMMUNITY): Payer: Self-pay | Admitting: Emergency Medicine

## 2020-03-12 ENCOUNTER — Emergency Department (HOSPITAL_COMMUNITY)
Admission: EM | Admit: 2020-03-12 | Discharge: 2020-03-13 | Disposition: A | Discharge status: Home / Self Care | Payer: Medicare Other | Attending: Emergency Medicine | Admitting: Emergency Medicine

## 2020-03-12 ENCOUNTER — Telehealth: Payer: Self-pay | Admitting: Family Medicine

## 2020-03-12 ENCOUNTER — Emergency Department (HOSPITAL_COMMUNITY): Payer: Medicare Other

## 2020-03-12 DIAGNOSIS — Z8541 Personal history of malignant neoplasm of cervix uteri: Secondary | ICD-10-CM | POA: Insufficient documentation

## 2020-03-12 DIAGNOSIS — Z7982 Long term (current) use of aspirin: Secondary | ICD-10-CM | POA: Insufficient documentation

## 2020-03-12 DIAGNOSIS — Z79899 Other long term (current) drug therapy: Secondary | ICD-10-CM | POA: Insufficient documentation

## 2020-03-12 DIAGNOSIS — I2510 Atherosclerotic heart disease of native coronary artery without angina pectoris: Secondary | ICD-10-CM | POA: Diagnosis not present

## 2020-03-12 DIAGNOSIS — R0789 Other chest pain: Secondary | ICD-10-CM | POA: Insufficient documentation

## 2020-03-12 DIAGNOSIS — E785 Hyperlipidemia, unspecified: Secondary | ICD-10-CM | POA: Insufficient documentation

## 2020-03-12 DIAGNOSIS — I2582 Chronic total occlusion of coronary artery: Secondary | ICD-10-CM | POA: Diagnosis not present

## 2020-03-12 DIAGNOSIS — Z96659 Presence of unspecified artificial knee joint: Secondary | ICD-10-CM | POA: Insufficient documentation

## 2020-03-12 DIAGNOSIS — E1169 Type 2 diabetes mellitus with other specified complication: Secondary | ICD-10-CM | POA: Diagnosis not present

## 2020-03-12 DIAGNOSIS — I10 Essential (primary) hypertension: Secondary | ICD-10-CM | POA: Diagnosis not present

## 2020-03-12 DIAGNOSIS — M79622 Pain in left upper arm: Secondary | ICD-10-CM | POA: Diagnosis not present

## 2020-03-12 DIAGNOSIS — F17210 Nicotine dependence, cigarettes, uncomplicated: Secondary | ICD-10-CM | POA: Diagnosis not present

## 2020-03-12 LAB — CBC
HCT: 46.6 % — ABNORMAL HIGH (ref 36.0–46.0)
Hemoglobin: 15 g/dL (ref 12.0–15.0)
MCH: 30.9 pg (ref 26.0–34.0)
MCHC: 32.2 g/dL (ref 30.0–36.0)
MCV: 95.9 fL (ref 80.0–100.0)
Platelets: 223 10*3/uL (ref 150–400)
RBC: 4.86 MIL/uL (ref 3.87–5.11)
RDW: 13.9 % (ref 11.5–15.5)
WBC: 9.2 10*3/uL (ref 4.0–10.5)
nRBC: 0 % (ref 0.0–0.2)

## 2020-03-12 LAB — BASIC METABOLIC PANEL
Anion gap: 11 (ref 5–15)
BUN: 22 mg/dL (ref 8–23)
CO2: 28 mmol/L (ref 22–32)
Calcium: 9 mg/dL (ref 8.9–10.3)
Chloride: 97 mmol/L — ABNORMAL LOW (ref 98–111)
Creatinine, Ser: 1.22 mg/dL — ABNORMAL HIGH (ref 0.44–1.00)
GFR, Estimated: 47 mL/min — ABNORMAL LOW (ref >60)
Glucose, Bld: 123 mg/dL — ABNORMAL HIGH (ref 70–99)
Potassium: 3.4 mmol/L — ABNORMAL LOW (ref 3.5–5.1)
Sodium: 136 mmol/L (ref 135–145)

## 2020-03-12 LAB — TROPONIN I (HIGH SENSITIVITY)
Troponin I (High Sensitivity): <2 ng/L (ref <18)
Troponin I (High Sensitivity): 2 ng/L (ref <18)

## 2020-03-12 MED ORDER — ISOSORBIDE MONONITRATE ER 30 MG PO TB24: 30.0000 mg | ORAL_TABLET | Freq: Every day | ORAL | 1 refills | Status: DC

## 2020-03-12 NOTE — Addendum Note (Signed)
Addended by: Merlene Laughter on: 03/12/2020 04:40 PM   Modules accepted: Orders

## 2020-03-12 NOTE — ED Triage Notes (Signed)
Pt complains of chest pain that began a few months ago. Pt states the pain is radiating to her left arm. Pain is described as a dull ache with sharp pain to the left should. Hx of cardiac stent placement.

## 2020-03-12 NOTE — Telephone Encounter (Signed)
Reports nitroglycerin relieved by nitro x's 2. Informed Dr. Domenic Polite, [place] and advised patient to keep 03/15/2020 appt with AQ, increase imdur to 30 mg daily and if symptoms continue, to go to the ED for an evaluation. Verbalized understanding of plan.

## 2020-03-12 NOTE — Telephone Encounter (Signed)
Reports intermittent left arm pain that started 1 month ago rated 10/10. Denies chest pain, sob or dizziness. Reports that in the past she had bilateral arm pain before having stents placed. Has not used tylenol or nitroglycerin. Advised to use her prn nitroglycerin to see if pain subsides and can also try tylenol prn. First available appointment scheduled with Katina Dung NP for 03/15/20 @1 :30 pm and advised if symptoms worsen or don't improve, to go to the ED for an evaluation. Verbalized understanding of plan.

## 2020-03-12 NOTE — Telephone Encounter (Signed)
Patient called wanting to know if she can take 30 mg of Isosorbide.

## 2020-03-12 NOTE — Telephone Encounter (Signed)
Patient called back stating that she has taken 2 nitroglycerin pills. States that she is having no pain at this time.

## 2020-03-13 NOTE — ED Provider Notes (Signed)
[facility] EMERGENCY DEPARTMENT Provider Note   CSN: [id number] Arrival date & time: 03/12/20  1935   Time seen 12:39 AM  History Chief Complaint  Patient presents with  . Chest Pain    Sandy Meyer is a 74 y.o. female.  HPI   Patient states 21 years ago she was having bilateral arm pains and had a normal stress test, and echocardiogram.  Due to her family history they did a cardiac cath and she had a 90% LAD lesion that had a stent placed.  She states she started having severe arm pain again off and on for the past couple weeks.  She states about 5 PM tonight her left upper arm between her shoulder and her elbow was hurting.  She took 1 nitroglycerin which eased her pain a little and then after the 2nd 1 her pain went away.  She states however about an hour prior to arrival her arm started hurting again.  She states after she got to the ED she had central chest pain that she describes as dull and achy that lasted a few minutes.  She states the last time she had chest pain like that was a long time ago.  She denies associated shortness of breath, diaphoresis, nausea, or vomiting.  Patient continues to smoke 1/3 pack/day.  She states she called her cardiologist office today and they told her to come to the ED to be evaluated.  She states the last time she saw Dr. Bronson Ing in May he had stopped some of her blood pressure medication.  At the time she was in the office her blood pressure was 84/60.  She was seen again by PA Leonides Sake on August 3 and at that time was started on isosorbide.  PCP Dekoninck, Dahlia Bailiff, NP   Past Medical History:  Diagnosis Date  . Anxiety   . Arthritis   . Blockage of coronary artery of heart ([place])   . CAD (coronary artery disease)   . Cervical cancer ([place])   . Coronary artery disease   . Depression   . Diabetes ([place])   . Dyslipidemia   . Hyperlipidemia   . Hypertension   . Joint pain   . Kidney stones   . MI (myocardial infarction) ([place])   . Syncope      There are no problems to display for this patient.   Past Surgical History:  Procedure Laterality Date  . CESAREAN SECTION    . complete hysterectomy    . REPLACEMENT TOTAL KNEE       OB History   No obstetric history on file.     Family History  Problem Relation Age of Onset  . Heart disease Mother   . Heart attack Mother   . Hypertension Mother   . High Cholesterol Mother   . Sudden death Mother   . Heart disease Father   . Diabetes Father   . Heart attack Father   . High Cholesterol Father   . Hypertension Father   . Sudden death Father   . Fainting Sister   . Hypertension Sister   . Heart attack Sister   . High Cholesterol Sister   . Heart disease Brother   . Diabetes Brother   . Heart attack Brother   . High Cholesterol Brother   . Stroke Brother     Social History   Tobacco Use  . Smoking status: Current Every Day Smoker    Packs/day: 0.25    Types: Cigarettes  Start date: 02/17/1961  . Smokeless tobacco: Never Used  Vaping Use  . Vaping Use: Never used  Substance Use Topics  . Alcohol use: Not Currently  . Drug use: Never    Home Medications Prior to Admission medications   Medication Sig Start Date End Date Taking? Authorizing Provider  amitriptyline (ELAVIL) 10 MG tablet Take 20 mg by mouth at bedtime.     [provider]  aspirin 81 MG chewable tablet Chew 81 mg by mouth every evening.     [provider]  atorvastatin (LIPITOR) 80 MG tablet Take 1 tablet (80 mg total) by mouth daily. 06/28/19   Herminio Commons, MD  estradiol (VIVELLE-DOT) 0.05 MG/24HR patch Place 1 patch onto the skin every Sunday.     [provider]  ezetimibe (ZETIA) 10 MG tablet Take 1 tablet (10 mg total) by mouth daily. 07/24/19   Herminio Commons, MD  famotidine (PEPCID) 20 MG tablet Take 1 tablet (20 mg total) by mouth 2 (two) times daily. 09/16/17   Fredia Sorrow, MD  gabapentin (NEURONTIN) 300 MG capsule Take 300 mg by mouth  2 (two) times daily. 08/25/17   [provider]  hydrochlorothiazide (HYDRODIURIL) 25 MG tablet TAKE 1 TABLET BY MOUTH TWICE A DAY 12/27/19   Verta Ellen., NP  HYDROmorphone (DILAUDID) 2 MG tablet Take by mouth every 4 (four) hours as needed for severe pain.    [provider]  icosapent Ethyl (VASCEPA) 1 g capsule Take 2 capsules (2 g total) by mouth 2 (two) times daily. 10/05/19 11/28/19  Herminio Commons, MD  isosorbide mononitrate (IMDUR) 30 MG 24 hr tablet Take 1 tablet (30 mg total) by mouth daily. 03/12/20 06/10/20  Satira Sark, MD  latanoprost (XALATAN) 0.005 % ophthalmic solution Place 1 drop into both eyes at bedtime.     [provider]  LORazepam (ATIVAN) 1 MG tablet Take 2 mg by mouth at bedtime.     [provider]  nitroGLYCERIN (NITROSTAT) 0.4 MG SL tablet ONE TABLET UNDER TONGUE AS NEEDED FOR CHEST PAIN 03/22/19   Herminio Commons, MD  Omega-3 Fatty Acids (FISH OIL) 1000 MG CAPS Take 1 capsule by mouth at bedtime.     [provider]  potassium citrate (UROCIT-K) 10 MEQ (1080 MG) SR tablet Take 1 tablet (10 mEq total) by mouth 4 (four) times daily. 08/08/19   Franchot Gallo, MD  sertraline (ZOLOFT) 100 MG tablet Take 100 mg by mouth daily.    [provider]  timolol (BETIMOL) 0.5 % ophthalmic solution Place 1 drop into both eyes daily.     [provider]    Allergies    Azithromycin, Sulfa antibiotics, Bactrim [sulfamethoxazole-trimethoprim], and Celebrex [celecoxib]  Review of Systems   Review of Systems  All other systems reviewed and are negative.   Physical Exam Updated Vital Signs BP 123/78   Pulse 83   Temp 98.6 F (37 C) (Oral)   Resp 18   Ht 5\' 9"  (1.753 m)   Wt 83.9 kg   SpO2 92%   BMI 27.32 kg/m   Physical Exam Constitutional:      General: She is not in acute distress.    Appearance: Normal appearance. She is obese.  HENT:     Head: Normocephalic and atraumatic.      Right Ear: External ear normal.     Left Ear: External ear normal.  Eyes:     Extraocular Movements: Extraocular movements intact.  Conjunctiva/sclera: Conjunctivae normal.     Pupils: Pupils are equal, round, and reactive to light.  Cardiovascular:     Rate and Rhythm: Normal rate and regular rhythm.     Pulses: Normal pulses.     Heart sounds: Normal heart sounds. No murmur heard.   Pulmonary:     Effort: Pulmonary effort is normal. No respiratory distress.     Breath sounds: Normal breath sounds.  Musculoskeletal:     Cervical back: Normal range of motion.     Right lower leg: No edema.     Left lower leg: No edema.     Comments: Patient has some pain to palpation in her left shoulder.  She states it hurts on abduction.  She states she has difficulty getting her clothes off and on due to inability to raise her arm well because of pain.  Skin:    General: Skin is warm and dry.  Neurological:     General: No focal deficit present.     Mental Status: She is alert and oriented to person, place, and time.     Cranial Nerves: No cranial nerve deficit.  Psychiatric:        Mood and Affect: Mood normal.        Behavior: Behavior normal.        Thought Content: Thought content normal.     ED Results / Procedures / Treatments   Labs (all labs ordered are listed, but only abnormal results are displayed) Results for orders placed or performed during the hospital encounter of 63/78/58  Basic metabolic panel  Result Value Ref Range   Sodium 136 135 - 145 mmol/L   Potassium 3.4 (L) 3.5 - 5.1 mmol/L   Chloride 97 (L) 98 - 111 mmol/L   CO2 28 22 - 32 mmol/L   Glucose, Bld 123 (H) 70 - 99 mg/dL   BUN 22 8 - 23 mg/dL   Creatinine, Ser 1.22 (H) 0.44 - 1.00 mg/dL   Calcium 9.0 8.9 - 10.3 mg/dL   GFR, Estimated 47 (L) >60 mL/min   Anion gap 11 5 - 15  CBC  Result Value Ref Range   WBC 9.2 4.0 - 10.5 K/uL   RBC 4.86 3.87 - 5.11 MIL/uL   Hemoglobin 15.0 12.0 - 15.0 g/dL   HCT 46.6  (H) 36 - 46 %   MCV 95.9 80.0 - 100.0 fL   MCH 30.9 26.0 - 34.0 pg   MCHC 32.2 30.0 - 36.0 g/dL   RDW 13.9 11.5 - 15.5 %   Platelets 223 150 - 400 K/uL   nRBC 0.0 0.0 - 0.2 %  Troponin I (High Sensitivity)  Result Value Ref Range   Troponin I (High Sensitivity) <2 <18 ng/L  Troponin I (High Sensitivity)  Result Value Ref Range   Troponin I (High Sensitivity) 2 <18 ng/L   Laboratory interpretation all normal except stable renal insufficiency, mild hypokalemia    EKG EKG Interpretation  Date/Time:  Tuesday March 12 2020 19:58:22 EST Ventricular Rate:  85 PR Interval:  176 QRS Duration: 82 QT Interval:  376 QTC Calculation: 447 R Axis:   24 Text Interpretation: Normal sinus rhythm Nonspecific T wave abnormality No significant change since last tracing 16 Sep 2017 Confirmed by Rolland Porter [phone]) on 03/12/2020 11:21:49 PM   Radiology DG Chest 2 View  Result Date: 03/12/2020 CLINICAL DATA:  Chest pain EXAM: CHEST - 2 VIEW COMPARISON:  Sep 16, 2017 FINDINGS: The heart size and mediastinal contours  are within normal limits. Both lungs are clear. The visualized skeletal structures are unremarkable. IMPRESSION: No active cardiopulmonary disease. Electronically Signed   By: Prudencio Pair M.D.   On: 03/12/2020 20:34    Procedures Procedures (including critical care time)  Medications Ordered in ED Medications - No data to display  ED Course  I have reviewed the triage vital signs and the nursing notes.  Pertinent labs & imaging results that were available during my care of the patient were reviewed by me and considered in my medical decision making (see chart for details).    MDM Rules/Calculators/A&P                         Patient's heart score is 4.  She is asking me about doing the CT to check for coronary artery disease. We do not have that available here, it would need to be done at [facility]. I'm going to talk to the cardiologist on call to see if they should keep  her in overnight to see cardiology in the morning.  2:28 AM patient was discussed with cardiology, Dr. Kalman Shan.  He feels like patient could be followed up in the office.  When I went back to tell the patient the cardiology felt she could be discharged she was ambulatory in the room in no distress.   Final Clinical Impression(s) / ED Diagnoses Final diagnoses:  Atypical chest pain    Rx / DC Orders ED Discharge Orders    None      Plan discharge  Rolland Porter, MD, Barbette Or, MD 03/13/20 [phone]

## 2020-03-13 NOTE — Discharge Instructions (Addendum)
Please call your cardiology office in the morning to get an follow-up appointment for your ED visit tonight.  Return to the emergency department if you get chest pain that lasts constantly more than 20 or 30 minutes and is not relieved by nitroglycerin.

## 2020-03-14 NOTE — Progress Notes (Deleted)
Cardiology Office Note  Date: 03/14/2020   ID: Sandy Meyer, DOB [birth year], MRN [id number]  PCP:  Dara Lords, NP  Cardiologist:  No primary care provider on file. Electrophysiologist:  None   Chief Complaint: Follow-up CAD  History of Present Illness: Sandy Meyer is a 74 y.o. female with a history of CAD with previous LAD stent placed in 2001, HTN, HLD, tobacco abuse.  Last office visit with Dr. Bronson Ing 09/15/2019.  She denied any chest pain, palpitations, dyspnea or lower extremity edema.  She felt fatigued but was sure it was due to depression due to the recent death of her mother and her pet.  She was hypotensive and had held the evening dose of her lisinopril for several days to prior week.  She was hypertensive during clinic visit but denied dizziness or syncope.  She stated her heart rate had gotten into the 40 bpm range in the evenings on at least 2 occasions but she was asymptomatic.  She presented last vist with some complaints of left arm pain.  She stated she has had one episode recently She denied specific chest pain or shoulder pain as she had when she had prior stent in 2001.  She denied any specific anginal or exertional symptoms. She had been walking up to 16 miles per week without symptoms. No orthostatic symptoms, palpitations, CVA or TIA like symptoms, PND, orthopnea. She states blood pressures had been fluctuating. She stated sometimes the SBP could be in the mid 90's and as high as the 140's. After questioning she stated SBP has been averaging in the 120's to 130's. She denied any exertional angina or use of SL NTG. No complaints or orthostatic symptoms, palpitations, or arrhythmias, PND, orthopnea, CVA or TIA like symptoms, claudication like symptoms or LE edema.  She called the office on 03/12/2020 wanting to know if she could take Imdur 30 mg. She c/o of left arm pain starting 1 month prior 10/10 severity. Reported having bilateral arm pain before having  previous stent placement. She was advised to use SL NTG and if pain did not improve to go to the ED. Patient called back later in the day stating she took 2 SL NTG and pain resolved. She presented to [facility] ED around 8pm on the same day with continued CP radiating to left arm. Troponins were negative x 2  CXR negative. Chest pain had resolved. She was discharged. She is a long term smoker and continues to smoke 1/3 ppd.  Past Medical History:  Diagnosis Date  . Anxiety   . Arthritis   . Blockage of coronary artery of heart ([place])   . CAD (coronary artery disease)   . Cervical cancer ([place])   . Coronary artery disease   . Depression   . Diabetes ([place])   . Dyslipidemia   . Hyperlipidemia   . Hypertension   . Joint pain   . Kidney stones   . MI (myocardial infarction) ([place])   . Syncope     Past Surgical History:  Procedure Laterality Date  . CESAREAN SECTION    . complete hysterectomy    . REPLACEMENT TOTAL KNEE      Current Outpatient Medications  Medication Sig Dispense Refill  . amitriptyline (ELAVIL) 10 MG tablet Take 20 mg by mouth at bedtime.     [name] aspirin 81 MG chewable tablet Chew 81 mg by mouth every evening.     [name] atorvastatin (LIPITOR) 80 MG tablet Take 1 tablet (80 mg total)  by mouth daily. 90 tablet 1  . estradiol (VIVELLE-DOT) 0.05 MG/24HR patch Place 1 patch onto the skin every Sunday.     . ezetimibe (ZETIA) 10 MG tablet Take 1 tablet (10 mg total) by mouth daily. 90 tablet 0  . famotidine (PEPCID) 20 MG tablet Take 1 tablet (20 mg total) by mouth 2 (two) times daily. 30 tablet 0  . gabapentin (NEURONTIN) 300 MG capsule Take 300 mg by mouth 2 (two) times daily.  2  . hydrochlorothiazide (HYDRODIURIL) 25 MG tablet TAKE 1 TABLET BY MOUTH TWICE A DAY 180 tablet 2  . HYDROmorphone (DILAUDID) 2 MG tablet Take by mouth every 4 (four) hours as needed for severe pain.    [name] icosapent Ethyl (VASCEPA) 1 g capsule Take 2 capsules (2 g total) by mouth 2 (two) times daily. 32 capsule  0  . isosorbide mononitrate (IMDUR) 30 MG 24 hr tablet Take 1 tablet (30 mg total) by mouth daily. 90 tablet 1  . latanoprost (XALATAN) 0.005 % ophthalmic solution Place 1 drop into both eyes at bedtime.     [name] LORazepam (ATIVAN) 1 MG tablet Take 2 mg by mouth at bedtime.     . nitroGLYCERIN (NITROSTAT) 0.4 MG SL tablet ONE TABLET UNDER TONGUE AS NEEDED FOR CHEST PAIN 25 tablet 3  . Omega-3 Fatty Acids (FISH OIL) 1000 MG CAPS Take 1 capsule by mouth at bedtime.     . potassium citrate (UROCIT-K) 10 MEQ (1080 MG) SR tablet Take 1 tablet (10 mEq total) by mouth 4 (four) times daily. 180 tablet 3  . sertraline (ZOLOFT) 100 MG tablet Take 100 mg by mouth daily.    . timolol (BETIMOL) 0.5 % ophthalmic solution Place 1 drop into both eyes daily.      No current facility-administered medications for this visit.   Allergies:  Azithromycin, Sulfa antibiotics, Bactrim [sulfamethoxazole-trimethoprim], and Celebrex [celecoxib]   Social History: The patient  reports that she has been smoking cigarettes. She started smoking about 59 years ago. She has been smoking about 0.25 packs per day. She has never used smokeless tobacco. She reports previous alcohol use. She reports that she does not use drugs.   Family History: The patient's family history includes Diabetes in her brother and father; Fainting in her sister; Heart attack in her brother, father, mother, and sister; Heart disease in her brother, father, and mother; High Cholesterol in her brother, father, mother, and sister; Hypertension in her father, mother, and sister; Stroke in her brother; Sudden death in her father and mother.   ROS:  Please see the history of present illness. Otherwise, complete review of systems is positive for none.  All other systems are reviewed and negative.   Physical Exam: VS:  There were no vitals taken for this visit., BMI There is no height or weight on file to calculate BMI.  Wt Readings from Last 3 Encounters:   03/12/20 185 lb (83.9 kg)  11/28/19 197 lb (89.4 kg)  09/15/19 194 lb 6.4 oz (88.2 kg)    General: Patient appears comfortable at rest. Neck: Supple, no elevated JVP or carotid bruits, no thyromegaly. Lungs: Clear to auscultation, nonlabored breathing at rest. Cardiac: Regular rate and rhythm, no S3 or significant systolic murmur, no pericardial rub. Extremities: No pitting edema, distal pulses 2+. Skin: Warm and dry. Musculoskeletal: No kyphosis. Neuropsychiatric: Alert and oriented x3, affect grossly appropriate.  ECG:  EKG 09/15/2019 NSR RSR' VI , V2, suggesting RV conduction delay  Recent Labwork: 03/12/2020: BUN  22; Creatinine, Ser 1.22; Hemoglobin 15.0; Platelets 223; Potassium 3.4; Sodium 136  No results found for: CHOL, TRIG, HDL, CHOLHDL, VLDL, LDLCALC, LDLDIRECT  Other Studies Reviewed Today:  Nuclear stress test on 11/10/16 was normal.  Echocardiogram demonstrated normal left ventricular systolic function and regional wall motion, LVEF 60-65%. There was mild mitral regurgitation and grade indeterminate diastolic dysfunction.  Assessment and Plan:     1. CAD in native artery Previous stenting of LAD 2001. Having recent left arm pain x 1 episode described "as if someone took a wire and inserted it down my left arm". Not associated with exertion. No radiation or associated symptoms. Start Imdur 15 mg daily. Continue ASA 81 mg daily, NTG SL prn.  2. Essential hypertension She states on her home blood pressure monitor her blood pressures have been fluctuating. She states sometimes the systolic blood pressures have been in the mid 90's and as high as the 140's. After further questioning she admits he SBP usually averages in the 120's to 130's. Continue HCTZ daily.  3. Hyperlipidemia LDL goal <70 Lipid panel 07/06/2019 TG 235, TC 126, HDL 37, LDL 60. Continue Zetia 10 mg daily , Atorvastatin 80 mg daily.  Medication Adjustments/Labs and Tests Ordered: Current medicines are  reviewed at length with the patient today.  Concerns regarding medicines are outlined above.   Disposition: Follow-up with Dr Harl Bowie or [facility] 6 months  Signed, Levell July, NP 03/14/2020 10:07 PM    [facility] [place], [place], [place] [address] Phone: [phone]; Fax: [phone]

## 2020-03-15 ENCOUNTER — Ambulatory Visit: Payer: Medicare Other | Admitting: Family Medicine

## 2020-03-18 NOTE — Progress Notes (Signed)
Cardiology Office Note  Date: 03/19/2020   ID: Sandy Meyer, DOB May 24, 1945, MRN [id number]  PCP:  Sandy Lords, NP  Cardiologist:  No primary care provider on file. Electrophysiologist:  None   Chief Complaint: Follow-up CAD  History of Present Illness: Sandy Meyer is a 74 y.o. female with a history of CAD with previous LAD stent placed in 2001, HTN, HLD, tobacco abuse.  Last office visit with Dr. Bronson Ing 09/15/2019.  She denied any chest pain, palpitations, dyspnea or lower extremity edema.  She felt fatigued but was sure it was due to depression due to the recent death of her mother and her pet.  She was hypotensive and had held the evening dose of her lisinopril for several days to prior week.  She was hypertensive during clinic visit but denied dizziness or syncope.  She stated her heart rate had gotten into the 40 bpm range in the evenings on at least 2 occasions but she was asymptomatic.  She presented last vist with some complaints of left arm pain.  She stated she has had one episode recently She denied specific chest pain or shoulder pain as she had when she had prior stent in 2001.  She denied any specific anginal or exertional symptoms. She had been walking up to 16 miles per week without symptoms. No orthostatic symptoms, palpitations, CVA or TIA like symptoms, PND, orthopnea. She states blood pressures had been fluctuating. She stated sometimes the SBP could be in the mid 90's and as high as the 140's. After questioning she stated SBP has been averaging in the 120's to 130's. She denied any exertional angina or use of SL NTG. No complaints or orthostatic symptoms, palpitations, or arrhythmias, PND, orthopnea, CVA or TIA like symptoms, claudication like symptoms or LE edema.  She called the office on 03/12/2020 wanting to know if she could take Imdur 30 mg. She c/o of left arm pain starting 1 month prior 10/10 severity. Reported having bilateral arm pain before having  previous stent placement in 2001. She was advised to use SL NTG and if pain did not improve to go to the ED. Patient called back later in the day stating she took 2 SL NTG and pain resolved. She presented to [facility] around 8pm on the same day with continued CP radiating to left arm. Troponins were negative x 2  CXR negative. Chest pain had resolved. She was discharged. She is a long term smoker and continues to smoke 1/3 ppd.    She presents today for follow-up status post recent emergency room visit.  She states increasing the Imdur has helped her chest pain/left arm pain pain some but has not totally alleviated the pain.  She states she is taking at least 2 nitroglycerin sublingual in addition to the Imdur over the last few days since discharge from emergency room.  She is continuing to smoke.  She states her diabetes is out of control.  She states her recent hemoglobin A1c was 7.4%.  Blood pressure is 100/60 but she denies any orthostatic symptoms, dizziness, lightheadedness, presyncopal or syncopal episodes.  States her systolic blood pressure usually runs in the 110 range.  Past Medical History:  Diagnosis Date  . Anxiety   . Arthritis   . Blockage of coronary artery of heart ([facility])   . CAD (coronary artery disease)   . Cervical cancer ([place])   . Coronary artery disease   . Depression   . Diabetes ([place])   . Dyslipidemia   .  Hyperlipidemia   . Hypertension   . Joint pain   . Kidney stones   . MI (myocardial infarction) ([place])   . Syncope     Past Surgical History:  Procedure Laterality Date  . CESAREAN SECTION    . complete hysterectomy    . REPLACEMENT TOTAL KNEE      Current Outpatient Medications  Medication Sig Dispense Refill  . amitriptyline (ELAVIL) 10 MG tablet Take 20 mg by mouth at bedtime.     [name] aspirin 81 MG chewable tablet Chew 81 mg by mouth every evening.     [name] atorvastatin (LIPITOR) 80 MG tablet Take 1 tablet (80 mg total) by mouth daily. 90 tablet 1  . estradiol  (VIVELLE-DOT) 0.05 MG/24HR patch Place 1 patch onto the skin every Sunday.     . ezetimibe (ZETIA) 10 MG tablet Take 1 tablet (10 mg total) by mouth daily. 90 tablet 0  . famotidine (PEPCID) 20 MG tablet Take 1 tablet (20 mg total) by mouth 2 (two) times daily. 30 tablet 0  . gabapentin (NEURONTIN) 300 MG capsule Take 300 mg by mouth 2 (two) times daily.  2  . hydrochlorothiazide (HYDRODIURIL) 25 MG tablet TAKE 1 TABLET BY MOUTH TWICE A DAY 180 tablet 2  . HYDROmorphone (DILAUDID) 2 MG tablet Take by mouth every 4 (four) hours as needed for severe pain.    [name] icosapent Ethyl (VASCEPA) 1 g capsule Take 2 capsules (2 g total) by mouth 2 (two) times daily. 32 capsule 0  . isosorbide mononitrate (IMDUR) 30 MG 24 hr tablet Take 1 tablet (30 mg total) by mouth daily. 90 tablet 1  . latanoprost (XALATAN) 0.005 % ophthalmic solution Place 1 drop into both eyes at bedtime.     [name] LORazepam (ATIVAN) 1 MG tablet Take 2 mg by mouth at bedtime.     . nitroGLYCERIN (NITROSTAT) 0.4 MG SL tablet ONE TABLET UNDER TONGUE AS NEEDED FOR CHEST PAIN 25 tablet 3  . Omega-3 Fatty Acids (FISH OIL) 1000 MG CAPS Take 1 capsule by mouth at bedtime.     . pioglitazone (ACTOS) 15 MG tablet Take 15 mg by mouth daily.    . potassium citrate (UROCIT-K) 10 MEQ (1080 MG) SR tablet Take 1 tablet (10 mEq total) by mouth 4 (four) times daily. 180 tablet 3  . sertraline (ZOLOFT) 100 MG tablet Take 100 mg by mouth daily.    . timolol (BETIMOL) 0.5 % ophthalmic solution Place 1 drop into both eyes daily.      No current facility-administered medications for this visit.   Allergies:  Azithromycin, Sulfa antibiotics, Bactrim [sulfamethoxazole-trimethoprim], and Celebrex [celecoxib]   Social History: The patient  reports that she has been smoking cigarettes. She started smoking about 59 years ago. She has been smoking about 0.25 packs per day. She has never used smokeless tobacco. She reports previous alcohol use. She reports that she does  not use drugs.   Family History: The patient's family history includes Diabetes in her brother and father; Fainting in her sister; Heart attack in her brother, father, mother, and sister; Heart disease in her brother, father, and mother; High Cholesterol in her brother, father, mother, and sister; Hypertension in her father, mother, and sister; Stroke in her brother; Sudden death in her father and mother.   ROS:  Please see the history of present illness. Otherwise, complete review of systems is positive for none.  All other systems are reviewed and negative.   Physical Exam: VS:  BP 100/60   Pulse 82   Ht 5\' 9"  (1.753 m)   Wt 192 lb 9.6 oz (87.4 kg)   SpO2 94%   BMI 28.44 kg/m , BMI Body mass index is 28.44 kg/m.  Wt Readings from Last 3 Encounters:  03/19/20 192 lb 9.6 oz (87.4 kg)  03/12/20 185 lb (83.9 kg)  11/28/19 197 lb (89.4 kg)    General: Patient appears comfortable at rest. Neck: Supple, no elevated JVP or carotid bruits, no thyromegaly. Lungs: Clear to auscultation, nonlabored breathing at rest. Cardiac: Regular rate and rhythm, no S3 or significant systolic murmur, no pericardial rub. Extremities: No pitting edema, distal pulses 2+. Skin: Warm and dry. Musculoskeletal: No kyphosis. Neuropsychiatric: Alert and oriented x3, affect grossly appropriate.  ECG:  EKG 09/15/2019 NSR RSR' VI , V2, suggesting RV conduction delay  Recent Labwork: 03/12/2020: BUN 22; Creatinine, Ser 1.22; Hemoglobin 15.0; Platelets 223; Potassium 3.4; Sodium 136  No results found for: CHOL, TRIG, HDL, CHOLHDL, VLDL, LDLCALC, LDLDIRECT  Other Studies Reviewed Today:  Nuclear stress test on 11/10/16 was normal.  Echocardiogram demonstrated normal left ventricular systolic function and regional wall motion, LVEF 60-65%. There was mild mitral regurgitation and grade indeterminate diastolic dysfunction.  Assessment and Plan:     1. CAD in native artery/chest pain Previous stenting of LAD  2001. Having recent left arm pain x 1 episode described "as if someone took a wire and inserted it down my left arm". Not associated with exertion. No radiation or associated symptoms.    Recent presentation to ED 03/12/2020 with very similar symptoms.  She was ruled out for ACS.  She increased her Imdur to 30 mg with some relief.  She states she has taken sublingual nitroglycerin on at least 2 occasions since being discharged from the hospital.  States the chest pain is on and off and usually not associated with activity.  She states her symptoms were atypical before she had her LAD stent placed back in 2001.  Please get a Lexiscan Cardiolite stress test to rule out ischemia as cause for chest pain.  Continue aspirin 81 mg daily.  Imdur 30 mg daily.  Sublingual nitroglycerin as needed for chest pain.  2. Essential hypertension Blood pressure is in the low range at 100/60 today.  Patient is stating her blood pressure systolic is running in the 110 range recently.  She denies any orthostatic symptoms today i.e. lightheadedness, dizziness, presyncopal or syncopal episodes.  3. Hyperlipidemia LDL goal <70 Lipid panel 07/06/2019 TG 235, TC 126, HDL 37, LDL 60. Continue Zetia 10 mg daily , Atorvastatin 80 mg daily.   4.  Type 2 diabetes Patient states her diabetes not well controlled.  Recent hemoglobin A1c was 7.4% per her statement.  Please refer patient to need endocrinology for management of her diabetes.   5.  Smoking Long history of smoking.  Patient states she smokes about 1/3 pack of cigarettes per day.  Highly advised her to cease smoking secondary to multiple predisposing factors for accelerating heart disease including hypertension, hyperlipidemia, type 2 diabetes.  Patient states she knows she needs to quit but finds it hard to stop.     Medication Adjustments/Labs and Tests Ordered: Current medicines are reviewed at length with the patient today.  Concerns regarding medicines are outlined  above.   Disposition: Follow-up with Dr Harl Bowie or APP 6 weeks  Signed, Levell July, NP 03/19/2020 4:44 PM    [facility] [address],  [place], [place] [address] Phone: [phone]; Fax: [phone]

## 2020-03-19 ENCOUNTER — Encounter: Payer: Self-pay | Admitting: Family Medicine

## 2020-03-19 ENCOUNTER — Encounter: Payer: Self-pay | Admitting: *Deleted

## 2020-03-19 ENCOUNTER — Ambulatory Visit (INDEPENDENT_AMBULATORY_CARE_PROVIDER_SITE_OTHER): Payer: Medicare Other | Admitting: Family Medicine

## 2020-03-19 ENCOUNTER — Other Ambulatory Visit: Payer: Self-pay

## 2020-03-19 VITALS — BP 100/60 | HR 82 | Ht 69.0 in | Wt 192.6 lb

## 2020-03-19 DIAGNOSIS — I2510 Atherosclerotic heart disease of native coronary artery without angina pectoris: Secondary | ICD-10-CM

## 2020-03-19 DIAGNOSIS — E119 Type 2 diabetes mellitus without complications: Secondary | ICD-10-CM

## 2020-03-19 DIAGNOSIS — R079 Chest pain, unspecified: Secondary | ICD-10-CM

## 2020-03-19 DIAGNOSIS — IMO0001 Reserved for inherently not codable concepts without codable children: Secondary | ICD-10-CM

## 2020-03-19 DIAGNOSIS — F17200 Nicotine dependence, unspecified, uncomplicated: Secondary | ICD-10-CM | POA: Diagnosis not present

## 2020-03-19 NOTE — Patient Instructions (Signed)
Medication Instructions:  Continue all current medications.  Labwork: none  Testing/Procedures:  Your physician has requested that you have a lexiscan myoview. For further information please visit [email or web address]. Please follow instruction sheet, as given.  Office will contact with results via phone or letter.    Follow-Up: 6 weeks   Any Other Special Instructions Will Be Listed Below (If Applicable). You have been referred to:  Endocrinology   If you need a refill on your cardiac medications before your next appointment, please call your pharmacy.

## 2020-03-27 ENCOUNTER — Telehealth: Payer: Medicare Other | Admitting: Cardiovascular Disease

## 2020-04-01 ENCOUNTER — Encounter (HOSPITAL_COMMUNITY): Payer: Medicare Other

## 2020-04-01 ENCOUNTER — Encounter (HOSPITAL_COMMUNITY): Payer: Medicare Other | Attending: Family Medicine

## 2020-04-08 DIAGNOSIS — N189 Chronic kidney disease, unspecified: Secondary | ICD-10-CM | POA: Insufficient documentation

## 2020-04-10 ENCOUNTER — Other Ambulatory Visit: Payer: Self-pay

## 2020-04-10 ENCOUNTER — Telehealth: Payer: Self-pay

## 2020-04-10 NOTE — Telephone Encounter (Signed)
Pt called saying Dr. Diona Fanti told her if she needed anything to call. Pt wanted an rx for her diabetic meds d/t a dr leaving facility she was going to. I explained we could not fill those type, only urology related. Advised her to call clinic back she was getting from prior.

## 2020-05-01 NOTE — Progress Notes (Unsigned)
Cardiology Office Note  Date: 05/02/2020   ID: Sandy Meyer, DOB Sep 15, 1945, MRN [id number]  PCP:  Lucius Conn, NP  Cardiologist:  No primary care provider on file. Electrophysiologist:  None   Chief Complaint: Follow-up CAD  History of Present Illness: Sandy Meyer is a 75 y.o. female with a history of CAD with previous LAD stent placed in 2001, HTN, HLD, tobacco abuse.  Last office visit with Dr. Purvis Sheffield 09/15/2019.  She denied any chest pain, palpitations, dyspnea or lower extremity edema.  She felt fatigued but was sure it was due to depression due to the recent death of her mother and her pet.  She was hypotensive and had held the evening dose of her lisinopril for several days to prior week.  She was hypertensive during clinic visit but denied dizziness or syncope.  She stated her heart rate had gotten into the 40 bpm range in the evenings on at least 2 occasions but she was asymptomatic.  She presented last vist with some complaints of left arm pain.  She stated she has had one episode recently She denied specific chest pain or shoulder pain as she had when she had prior stent in 2001.  She denied any specific anginal or exertional symptoms. She had been walking up to 16 miles per week without symptoms. No orthostatic symptoms, palpitations, CVA or TIA like symptoms, PND, orthopnea. She states blood pressures had been fluctuating. She stated sometimes the SBP could be in the mid 90's and as high as the 140's. After questioning she stated SBP has been averaging in the 120's to 130's. She denied any exertional angina or use of SL NTG. No complaints or orthostatic symptoms, palpitations, or arrhythmias, PND, orthopnea, CVA or TIA like symptoms, claudication like symptoms or LE edema.  She called the office on 03/12/2020 wanting to know if she could take Imdur 30 mg. She c/o of left arm pain starting 1 month prior 10/10 severity. Reported having bilateral arm pain before having  previous stent placement in 2001. She was advised to use SL NTG and if pain did not improve to go to the ED. Patient called back later in the day stating she took 2 SL NTG and pain resolved. She presented to [facility] ED around 8pm on the same day with continued CP radiating to left arm. Troponins were negative x 2  CXR negative. Chest pain had resolved. She was discharged. She is a long term smoker and continues to smoke 1/3 ppd.    She presented last visit for follow-up status post recent emergency room visit.  She stated increasing the Imdur had helped her chest pain/left arm pain pain some but had not totally alleviated the pain.  She stated she was taking at least 2 nitroglycerin sublingual in addition to the Imdur over the last few days since discharge from emergency room.  She was continuing to smoke.  She stated her diabetes was out of control.  She stated her recent hemoglobin A1c was 7.4%.  Blood pressure was 100/60 but she denied any orthostatic symptoms, dizziness, lightheadedness, presyncopal or syncopal episodes.  Stated her systolic blood pressure usually ran in the 110 range.  She is here today for 6-week follow-up.  She states the Imdur caused her to have vertigo and she stopped the medication.  Also stated her blood pressures were low and she self adjusted her lisinopril down to 10 mg.  Today's blood pressure 124/68.  Patient states she has not taken her lisinopril  today.  She denies any anginal or exertional symptoms, palpitations or arrhythmias, orthostatic symptoms i.e. lightheadedness, dizziness, vertigo-like symptoms, presyncopal or syncopal episode.  States she is having issues with her bladder with some functional incontinence and is scheduled to see her urologist soon.  Also complaining of diabetes not being well controlled and has a pending appointment with Dr. Fransico Him.  Complaining of some arthritic issues with left shoulder pain with problems adducting her arm.  Also complaining of some right  knee swelling recently.  He states she plans on seeing an orthopedist for her left shoulder pain and will let him look at her right knee.  She thinks she may have an joint effusion.   Past Medical History:  Diagnosis Date  . Anxiety   . Arthritis   . Blockage of coronary artery of heart ([facility])   . CAD (coronary artery disease)   . Cervical cancer ([facility])   . Coronary artery disease   . Depression   . Diabetes ([facility])   . Dyslipidemia   . Hyperlipidemia   . Hypertension   . Joint pain   . Kidney stones   . MI (myocardial infarction) ([facility])   . Syncope     Past Surgical History:  Procedure Laterality Date  . CESAREAN SECTION    . complete hysterectomy    . REPLACEMENT TOTAL KNEE      Current Outpatient Medications  Medication Sig Dispense Refill  . amitriptyline (ELAVIL) 10 MG tablet Take 20 mg by mouth at bedtime.     [name] aspirin 81 MG chewable tablet Chew 81 mg by mouth every evening.     [name] atorvastatin (LIPITOR) 80 MG tablet Take 1 tablet (80 mg total) by mouth daily. 90 tablet 1  . estradiol (VIVELLE-DOT) 0.05 MG/24HR patch Place 1 patch onto the skin every Sunday.     . ezetimibe (ZETIA) 10 MG tablet Take 1 tablet (10 mg total) by mouth daily. 90 tablet 0  . famotidine (PEPCID) 20 MG tablet Take 1 tablet (20 mg total) by mouth 2 (two) times daily. 30 tablet 0  . gabapentin (NEURONTIN) 300 MG capsule Take 300 mg by mouth 2 (two) times daily.  2  . hydrochlorothiazide (HYDRODIURIL) 25 MG tablet TAKE 1 TABLET BY MOUTH TWICE A DAY 180 tablet 2  . HYDROmorphone (DILAUDID) 2 MG tablet Take by mouth every 4 (four) hours as needed for severe pain.    [name] icosapent Ethyl (VASCEPA) 1 g capsule Take 2 capsules (2 g total) by mouth 2 (two) times daily. 32 capsule 0  . latanoprost (XALATAN) 0.005 % ophthalmic solution Place 1 drop into both eyes at bedtime.     [name] lisinopril (ZESTRIL) 5 MG tablet Take 1 tablet (5 mg total) by mouth daily. 90 tablet 3  . LORazepam (ATIVAN) 1 MG tablet Take 2 mg  by mouth at bedtime.     . nitroGLYCERIN (NITROSTAT) 0.4 MG SL tablet ONE TABLET UNDER TONGUE AS NEEDED FOR CHEST PAIN 25 tablet 3  . Omega-3 Fatty Acids (FISH OIL) 1000 MG CAPS Take 1 capsule by mouth at bedtime.     . pioglitazone (ACTOS) 15 MG tablet Take 15 mg by mouth daily.    . potassium citrate (UROCIT-K) 10 MEQ (1080 MG) SR tablet Take 1 tablet (10 mEq total) by mouth 4 (four) times daily. 180 tablet 3  . sertraline (ZOLOFT) 100 MG tablet Take 100 mg by mouth daily.    . timolol (BETIMOL) 0.5 % ophthalmic solution Place 1 drop  into both eyes daily.     . isosorbide mononitrate (IMDUR) 30 MG 24 hr tablet Take 1 tablet (30 mg total) by mouth daily. (Patient not taking: Reported on 05/02/2020) 90 tablet 1   No current facility-administered medications for this visit.   Allergies:  Azithromycin, Sulfa antibiotics, Bactrim [sulfamethoxazole-trimethoprim], and Celebrex [celecoxib]   Social History: The patient  reports that she has been smoking cigarettes. She started smoking about 59 years ago. She has been smoking about 0.33 packs per day. She has never used smokeless tobacco. She reports previous alcohol use. She reports that she does not use drugs.   Family History: The patient's family history includes Diabetes in her brother and father; Fainting in her sister; Heart attack in her brother, father, mother, and sister; Heart disease in her brother, father, and mother; High Cholesterol in her brother, father, mother, and sister; Hypertension in her father, mother, and sister; Stroke in her brother; Sudden death in her father and mother.   ROS:  Please see the history of present illness. Otherwise, complete review of systems is positive for none.  All other systems are reviewed and negative.   Physical Exam: VS:  BP 124/68   Pulse 70   Ht 5\' 9"  (1.753 m)   Wt 195 lb (88.5 kg)   SpO2 96%   BMI 28.80 kg/m , BMI Body mass index is 28.8 kg/m.  Wt Readings from Last 3 Encounters:  05/02/20  195 lb (88.5 kg)  03/19/20 192 lb 9.6 oz (87.4 kg)  03/12/20 185 lb (83.9 kg)    General: Patient appears comfortable at rest. Neck: Supple, no elevated JVP or carotid bruits, no thyromegaly. Lungs: Clear to auscultation, nonlabored breathing at rest. Cardiac: Regular rate and rhythm, no S3 or significant systolic murmur, no pericardial rub. Extremities: No pitting edema, distal pulses 2+. Skin: Warm and dry. Musculoskeletal: No kyphosis. Neuropsychiatric: Alert and oriented x3, affect grossly appropriate.  ECG:  EKG 09/15/2019 NSR RSR' VI , V2, suggesting RV conduction delay  Recent Labwork: 03/12/2020: BUN 22; Creatinine, Ser 1.22; Hemoglobin 15.0; Platelets 223; Potassium 3.4; Sodium 136  No results found for: CHOL, TRIG, HDL, CHOLHDL, VLDL, LDLCALC, LDLDIRECT  Other Studies Reviewed Today:  Nuclear stress test on 11/10/16 was normal.  Echocardiogram demonstrated normal left ventricular systolic function and regional wall motion, LVEF 60-65%. There was mild mitral regurgitation and grade indeterminate diastolic dysfunction.  Assessment and Plan:     1. CAD in native artery/chest pain Previous stenting of LAD 2001. Having  left arm pain x 1 episode described "as if someone took a wire and inserted it down my left arm". Not associated with exertion. No radiation or associated symptoms.  Today she states the arm pain in her left arm is aggravated by attempted abduction of left arm to 90 degrees which causes more pain.  She states she has had issues with her shoulder and it feels like some type of arthritic condition.  She states she did not pursue the stress test due to remembering she had an adverse issue with the previous chemical stress test in the past tachycardia and low blood pressures.  Currently denies any anginal symptoms.  She states she stopped the Imdur due to vertigo.  Continue aspirin 81 mg daily.  Continue sublingual nitroglycerin as needed for chest pain.  2. Essential  hypertension She states her blood pressures have been running low recently.  She states her systolic has been as low as 88.  She states she self adjusted  her lisinopril down to 10 mg as needed.  Advised her to decrease the lisinopril to 5 mg p.o. daily.  We will send him a new prescription for this dosage.  3. Hyperlipidemia LDL goal <70 Lipid panel 07/06/2019 TG 235, TC 126, HDL 37, LDL 60. Continue Zetia 10 mg daily , Atorvastatin 80 mg daily.   4.  Type 2 diabetes Patient states her diabetes not well controlled.  Recent hemoglobin A1c was 7.4% per her statement.  Patient states she has a pending appointment with Dr. Dorris Fetch on January 11 for management of her fluctuating blood sugars.   5.  Smoking Long history of smoking.  Patient states she smokes about 1/3 pack of cigarettes per day.  Highly advised her to cease smoking secondary to multiple predisposing factors for accelerating heart disease including hypertension, hyperlipidemia, type 2 diabetes.  Patient states she knows she needs to quit but finds it hard to stop.  Reinforced cessation.    Medication Adjustments/Labs and Tests Ordered: Current medicines are reviewed at length with the patient today.  Concerns regarding medicines are outlined above.   Disposition: Follow-up with Dr Harl Bowie or APP 6 months Signed, Levell July, NP 05/02/2020 4:15 PM    [facility] [place], [place], [place] [address] Phone: [phone]; Fax: [phone]

## 2020-05-02 ENCOUNTER — Other Ambulatory Visit: Payer: Self-pay

## 2020-05-02 ENCOUNTER — Encounter: Payer: Self-pay | Admitting: Family Medicine

## 2020-05-02 ENCOUNTER — Ambulatory Visit (INDEPENDENT_AMBULATORY_CARE_PROVIDER_SITE_OTHER): Payer: Medicare HMO | Admitting: Family Medicine

## 2020-05-02 VITALS — BP 124/68 | HR 70 | Ht 69.0 in | Wt 195.0 lb

## 2020-05-02 DIAGNOSIS — E119 Type 2 diabetes mellitus without complications: Secondary | ICD-10-CM

## 2020-05-02 DIAGNOSIS — I2510 Atherosclerotic heart disease of native coronary artery without angina pectoris: Secondary | ICD-10-CM | POA: Diagnosis not present

## 2020-05-02 DIAGNOSIS — E785 Hyperlipidemia, unspecified: Secondary | ICD-10-CM | POA: Diagnosis not present

## 2020-05-02 DIAGNOSIS — I10 Essential (primary) hypertension: Secondary | ICD-10-CM

## 2020-05-02 MED ORDER — METOPROLOL TARTRATE 50 MG PO TABS: ORAL_TABLET | ORAL | 11 refills | Status: DC

## 2020-05-02 MED ORDER — LISINOPRIL 5 MG PO TABS: 5.0000 mg | ORAL_TABLET | Freq: Every day | ORAL | 3 refills | Status: DC

## 2020-05-02 NOTE — Patient Instructions (Addendum)
   Medication Instructions:  Your physician has recommended you make the following change in your medication:  Decrease Lisinopril to 5 mg Daily   *If you need a refill on your cardiac medications before your next appointment, please call your pharmacy*   Lab Work: NONE   If you have labs (blood work) drawn today and your tests are completely normal, you will receive your results only by: [name] MyChart Message (if you have MyChart) OR . A paper copy in the mail If you have any lab test that is abnormal or we need to change your treatment, we will call you to review the results.   Testing/Procedures: NONE    Follow-Up: At [facility], you and your health needs are our priority.  As part of our continuing mission to provide you with exceptional heart care, we have created designated Provider Care Teams.  These Care Teams include your primary Cardiologist (physician) and Advanced Practice Providers (APPs -  Physician Assistants and Nurse Practitioners) who all work together to provide you with the care you need, when you need it.  We recommend signing up for the patient portal called "MyChart".  Sign up information is provided on this After Visit Summary.  MyChart is used to connect with patients for Virtual Visits (Telemedicine).  Patients are able to view lab/test results, encounter notes, upcoming appointments, etc.  Non-urgent messages can be sent to your provider as well.   To learn more about what you can do with MyChart, go to [email or web address].    Your next appointment:   6 month(s)  The format for your next appointment:   In Person  Provider:   Nena Polio, NP   Other Instructions Thank you for choosing [facility]!

## 2020-05-07 ENCOUNTER — Other Ambulatory Visit: Payer: Self-pay | Admitting: "Endocrinology

## 2020-05-07 ENCOUNTER — Encounter: Payer: Self-pay | Admitting: "Endocrinology

## 2020-05-07 ENCOUNTER — Ambulatory Visit: Payer: Medicare HMO | Admitting: "Endocrinology

## 2020-05-07 ENCOUNTER — Other Ambulatory Visit: Payer: Self-pay

## 2020-05-07 VITALS — BP 120/68 | HR 64 | Ht 69.0 in | Wt 194.0 lb

## 2020-05-07 DIAGNOSIS — E782 Mixed hyperlipidemia: Secondary | ICD-10-CM | POA: Diagnosis not present

## 2020-05-07 DIAGNOSIS — E1159 Type 2 diabetes mellitus with other circulatory complications: Secondary | ICD-10-CM | POA: Insufficient documentation

## 2020-05-07 DIAGNOSIS — I10 Essential (primary) hypertension: Secondary | ICD-10-CM | POA: Diagnosis not present

## 2020-05-07 LAB — POCT GLYCOSYLATED HEMOGLOBIN (HGB A1C): HbA1c, POC (controlled diabetic range): 6.9 % (ref 0.0–7.0)

## 2020-05-07 MED ORDER — LANCETS MISC: 1.0000 | 3 refills | Status: DC

## 2020-05-07 MED ORDER — TRUE METRIX BLOOD GLUCOSE TEST VI STRP: ORAL_STRIP | 12 refills | Status: DC

## 2020-05-07 NOTE — Progress Notes (Signed)
Endocrinology Consult Note       05/07/2020, 2:40 PM   Subjective:    Patient ID: Sandy Meyer, female    DOB: [birth year].  Sandy Meyer is being seen in consultation for management of currently uncontrolled symptomatic diabetes requested by  Dekoninck, Dahlia Bailiff, NP.   Past Medical History:  Diagnosis Date  . Anxiety   . Arthritis   . Blockage of coronary artery of heart ([place])   . CAD (coronary artery disease)   . Cervical cancer ([place])   . Coronary artery disease   . Depression   . Diabetes ([place])   . Dyslipidemia   . Hyperlipidemia   . Hypertension   . Joint pain   . Kidney stones   . MI (myocardial infarction) ([place])   . Syncope     Past Surgical History:  Procedure Laterality Date  . CESAREAN SECTION    . complete hysterectomy    . REPLACEMENT TOTAL KNEE      Social History   Socioeconomic History  . Marital status: Divorced    Spouse name: Not on file  . Number of children: Not on file  . Years of education: Not on file  . Highest education level: Not on file  Occupational History  . Not on file  Tobacco Use  . Smoking status: Current Every Day Smoker    Packs/day: 0.33    Types: Cigarettes    Start date: 02/17/1961  . Smokeless tobacco: Never Used  Vaping Use  . Vaping Use: Never used  Substance and Sexual Activity  . Alcohol use: Not Currently  . Drug use: Never  . Sexual activity: Not on file  Other Topics Concern  . Not on file  Social History Narrative  . Not on file   Social Determinants of Health   Financial Resource Strain: Not on file  Food Insecurity: Not on file  Transportation Needs: Not on file  Physical Activity: Not on file  Stress: Not on file  Social Connections: Not on file    Family History  Problem Relation Age of Onset  . Heart disease Mother   . Heart attack Mother   . Hypertension Mother   . High Cholesterol Mother   . Sudden death  Mother   . Heart disease Father   . Diabetes Father   . Heart attack Father   . High Cholesterol Father   . Hypertension Father   . Sudden death Father   . Fainting Sister   . Hypertension Sister   . Heart attack Sister   . High Cholesterol Sister   . Heart disease Brother   . Diabetes Brother   . Heart attack Brother   . High Cholesterol Brother   . Stroke Brother     Outpatient Encounter Medications as of 05/07/2020  Medication Sig  . fluconazole (DIFLUCAN) 150 MG tablet Take 150 mg by mouth daily as needed.  [name] glucose blood (TRUE METRIX BLOOD GLUCOSE TEST) test strip Use as instructed  . Lancets MISC 1 each by Does not apply route as directed.  [name] amitriptyline (ELAVIL) 10 MG tablet Take 20 mg by mouth at  bedtime.   [name] aspirin 81 MG chewable tablet Chew 81 mg by mouth every evening.   [name] atorvastatin (LIPITOR) 80 MG tablet Take 1 tablet (80 mg total) by mouth daily.  [name] estradiol (VIVELLE-DOT) 0.05 MG/24HR patch Place 1 patch onto the skin every Sunday.   . ezetimibe (ZETIA) 10 MG tablet Take 1 tablet (10 mg total) by mouth daily.  . famotidine (PEPCID) 20 MG tablet Take 1 tablet (20 mg total) by mouth 2 (two) times daily.  [name] gabapentin (NEURONTIN) 300 MG capsule Take 300 mg by mouth 2 (two) times daily.  . hydrochlorothiazide (HYDRODIURIL) 25 MG tablet TAKE 1 TABLET BY MOUTH TWICE A DAY  . HYDROmorphone (DILAUDID) 2 MG tablet Take by mouth every 4 (four) hours as needed for severe pain.  [name] icosapent Ethyl (VASCEPA) 1 g capsule Take 2 capsules (2 g total) by mouth 2 (two) times daily.  . isosorbide mononitrate (IMDUR) 30 MG 24 hr tablet Take 1 tablet (30 mg total) by mouth daily. (Patient not taking: Reported on 05/02/2020)  . latanoprost (XALATAN) 0.005 % ophthalmic solution Place 1 drop into both eyes at bedtime.   [name] lisinopril (ZESTRIL) 5 MG tablet Take 1 tablet (5 mg total) by mouth daily.  [name] LORazepam (ATIVAN) 1 MG tablet Take 2 mg by mouth at bedtime.   . nitroGLYCERIN  (NITROSTAT) 0.4 MG SL tablet ONE TABLET UNDER TONGUE AS NEEDED FOR CHEST PAIN  . Omega-3 Fatty Acids (FISH OIL) 1000 MG CAPS Take 1 capsule by mouth at bedtime.   . pioglitazone (ACTOS) 15 MG tablet Take 15 mg by mouth daily.  . potassium citrate (UROCIT-K) 10 MEQ (1080 MG) SR tablet Take 1 tablet (10 mEq total) by mouth 4 (four) times daily.  . sertraline (ZOLOFT) 100 MG tablet Take 100 mg by mouth daily.  . timolol (BETIMOL) 0.5 % ophthalmic solution Place 1 drop into both eyes daily.    No facility-administered encounter medications on file as of 05/07/2020.    ALLERGIES: Allergies  Allergen Reactions  . Azithromycin Other (See Comments)    unknown  . Lactose Intolerance (Gi)   . Sulfa Antibiotics Hives and Swelling  . Bactrim [Sulfamethoxazole-Trimethoprim] Hives, Swelling and Rash  . Celebrex [Celecoxib] Rash    VACCINATION STATUS:  There is no immunization history on file for this patient.  Diabetes She presents for her initial diabetic visit. She has type 2 diabetes mellitus. Onset time: She was diagnosed with August 2021, at the age of 75 years. Her disease course has been improving. There are no hypoglycemic associated symptoms. Pertinent negatives for hypoglycemia include no confusion, headaches, pallor or seizures. There are no diabetic associated symptoms. Pertinent negatives for diabetes include no chest pain, no polydipsia, no polyphagia and no polyuria. There are no hypoglycemic complications. Symptoms are stable. Diabetic complications include heart disease and nephropathy. Risk factors for coronary artery disease include diabetes mellitus, dyslipidemia, family history, hypertension, sedentary lifestyle, post-menopausal and tobacco exposure. Current diabetic treatments: She is currently on pioglitazone 15 mg p.o. daily at breakfast. She is following a generally unhealthy diet. When asked about meal planning, she reported none. She has not had a previous visit with a  dietitian. She participates in exercise intermittently. Her home blood glucose trend is decreasing steadily. Her overall blood glucose range is 130-140 mg/dl. An ACE inhibitor/angiotensin II receptor blocker is being taken.  Hyperlipidemia This is a chronic problem. The current episode started more than 1 year ago. Exacerbating diseases include diabetes. Pertinent negatives include no  chest pain, myalgias or shortness of breath. Current antihyperlipidemic treatment includes statins. Risk factors for coronary artery disease include diabetes mellitus, dyslipidemia, family history, hypertension, post-menopausal and a sedentary lifestyle.  Hypertension This is a chronic problem. The current episode started more than 1 year ago. Pertinent negatives include no chest pain, headaches, palpitations or shortness of breath. Risk factors for coronary artery disease include diabetes mellitus, dyslipidemia, family history, post-menopausal state, sedentary lifestyle and smoking/tobacco exposure. Past treatments include angiotensin blockers. Hypertensive end-organ damage includes kidney disease and CAD/MI.     Review of Systems  Constitutional: Negative for chills, fever and unexpected weight change.  HENT: Negative for trouble swallowing and voice change.   Eyes: Negative for visual disturbance.  Respiratory: Negative for cough, shortness of breath and wheezing.   Cardiovascular: Negative for chest pain, palpitations and leg swelling.  Gastrointestinal: Negative for diarrhea, nausea and vomiting.  Endocrine: Negative for cold intolerance, heat intolerance, polydipsia, polyphagia and polyuria.  Musculoskeletal: Negative for arthralgias and myalgias.  Skin: Negative for color change, pallor, rash and wound.  Neurological: Negative for seizures and headaches.  Psychiatric/Behavioral: Negative for confusion and suicidal ideas.    Objective:    Vitals with BMI 05/07/2020 05/02/2020 03/19/2020  Height 5\' 9"  5\' 9"   5\' 9"   Weight 194 lbs 195 lbs 192 lbs 10 oz  BMI 28.64 76.19 50.93  Systolic 267 124 580  Diastolic 68 68 60  Pulse 64 70 82    BP 120/68   Pulse 64   Ht 5\' 9"  (1.753 m)   Wt 194 lb (88 kg)   BMI 28.65 kg/m   Wt Readings from Last 3 Encounters:  05/07/20 194 lb (88 kg)  05/02/20 195 lb (88.5 kg)  03/19/20 192 lb 9.6 oz (87.4 kg)     Physical Exam Constitutional:      Appearance: She is well-developed.  HENT:     Head: Normocephalic and atraumatic.  Eyes:     Extraocular Movements: EOM normal.  Neck:     Thyroid: No thyromegaly.     Trachea: No tracheal deviation.  Cardiovascular:     Rate and Rhythm: Normal rate and regular rhythm.  Pulmonary:     Effort: Pulmonary effort is normal.  Abdominal:     Tenderness: There is no abdominal tenderness. There is no guarding.  Musculoskeletal:        General: No edema. Normal range of motion.     Cervical back: Normal range of motion and neck supple.  Skin:    General: Skin is warm and dry.     Coloration: Skin is not pale.     Findings: No erythema or rash.  Neurological:     Mental Status: She is alert and oriented to person, place, and time.     Cranial Nerves: No cranial nerve deficit.     Coordination: Coordination normal.     Deep Tendon Reflexes: Reflexes are normal and symmetric.  Psychiatric:        Mood and Affect: Mood and affect normal.        Judgment: Judgment normal.       CMP ( most recent) CMP     Component Value Date/Time   NA 136 03/12/2020 2100   K 3.4 (L) 03/12/2020 2100   CL 97 (L) 03/12/2020 2100   CO2 28 03/12/2020 2100   GLUCOSE 123 (H) 03/12/2020 2100   BUN 22 03/12/2020 2100   CREATININE 1.22 (H) 03/12/2020 2100   CALCIUM 9.0 03/12/2020 2100  GFRNONAA 47 (L) 03/12/2020 2100   GFRAA 54 (L) 09/16/2017 1850     Diabetic Labs (most recent): Lab Results  Component Value Date   HGBA1C 6.9 05/07/2020     Assessment & Plan:   1. DM type 2 causing vascular disease ([place])  -  Sandy Meyer has currently uncontrolled symptomatic type 2 DM since  75 years of age,  with most recent A1c of 6.9 %. Recent labs reviewed. - I had a long discussion with her about the progressive nature of diabetes and the pathology behind its complications. -her diabetes is complicated by coronary artery disease, CKD, smoking,  and she remains at a high risk for more acute and chronic complications which include CAD, CVA, CKD, retinopathy, and neuropathy. These are all discussed in detail with her.  - I have counseled her on diet  and weight management  by adopting a carbohydrate restricted/protein rich diet. Patient is encouraged to switch to  unprocessed or minimally processed     complex starch and increased protein intake (animal or plant source), fruits, and vegetables. -  she is advised to stick to a routine mealtimes to eat 3 meals  a day and avoid unnecessary snacks ( to snack only to correct hypoglycemia).   - she acknowledges that there is a room for improvement in her food and drink choices. - Suggestion is made for her to avoid simple carbohydrates  from her diet including Cakes, Sweet Desserts, Ice Cream, Soda (diet and regular), Sweet Tea, Candies, Chips, Cookies, Store Bought Juices, Alcohol in Excess of  1-2 drinks a day, Artificial Sweeteners,  Coffee Creamer, and "Sugar-free" Products. This will help patient to have more stable blood glucose profile and potentially avoid unintended weight gain.  - she will be scheduled with Jearld Fenton, RDN, CDE for diabetes education.  - I have approached her with the following individualized plan to manage  her diabetes and patient agrees:   -In light of her presentation with controlled glycemic profile, and point-of-care A1c of 6.9%, she would not need insulin treatment at this time. -She is advised to continue her pioglitazone 15 mg p.o. daily at breakfast. She will monitor blood glucose at least once a day before breakfast, she is  encouraged to call clinic for blood glucose levels less than 70 or above 200 mg /dl. -She may qualify for metformin if she is found to have normal renal function next visit.  - Specific targets for  A1c;  LDL, HDL,  and Triglycerides were discussed with the patient.  2) Blood Pressure /Hypertension:  her blood pressure is  controlled to target.   she is advised to continue her current medications including hydrochlorothiazide 25 mg p.o. daily with breakfast .  She is also on lisinopril 5 mg p.o. daily. 3) Lipids/Hyperlipidemia: No recent lipid panel to review.  She is currently on atorvastatin 80 mg p.o. nightly.     Side effects and precautions discussed with her.  4)  Weight/Diet:  Body mass index is 28.65 kg/m.  -   clearly complicating her diabetes care.   she is  a candidate for some weight loss.  Exercise, and detailed carbohydrates information provided  -  detailed on discharge instructions.  5) Chronic Care/Health Maintenance:  -she   on ACEI/ARB and Statin medications and  is encouraged to initiate and continue to follow up with Ophthalmology, Dentist,  Podiatrist at least yearly or according to recommendations, and advised to  quit smoking. I have recommended yearly flu vaccine  and pneumonia vaccine at least every 5 years; moderate intensity exercise for up to 150 minutes weekly; and  sleep for at least 7 hours a day.  - she is  advised to maintain close follow up with Dekoninck, Eustaquio Maize A, NP for primary care needs, as well as her other providers for optimal and coordinated care.   - Time spent in this patient care: 60 min, of which > 50% was spent in  counseling  her about her currently controlled, complicated type 2 diabetes, hyperlipidemia, hypertension and the rest reviewing her blood glucose logs , discussing her hypoglycemia and hyperglycemia episodes, reviewing her current and  previous labs / studies  ( including abstraction from other facilities) and medications  doses and  developing a  long term treatment plan based on the latest standards of care/ guidelines; and documenting her care.    Please refer to Patient Instructions for Blood Glucose Monitoring and Insulin/Medications Dosing Guide"  in media tab for additional information. Please  also refer to " Patient Self Inventory" in the Media  tab for reviewed elements of pertinent patient history.  Shelly Flatten participated in the discussions, expressed understanding, and voiced agreement with the above plans.  All questions were answered to her satisfaction. she is encouraged to contact clinic should she have any questions or concerns prior to her return visit.   Follow up plan: - Return in about 4 months (around 09/04/2020) for F/U with Pre-visit Labs, Meter, Logs, A1c here.Glade Lloyd, MD [facility] [address] [place], [place] [address] Phone: [phone]  Fax: [phone]    05/07/2020, 2:40 PM  This note was partially dictated with voice recognition software. Similar sounding words can be transcribed inadequately or may not  be corrected upon review.

## 2020-05-07 NOTE — Patient Instructions (Signed)

## 2020-05-08 DIAGNOSIS — Z79891 Long term (current) use of opiate analgesic: Secondary | ICD-10-CM | POA: Insufficient documentation

## 2020-05-08 DIAGNOSIS — M130 Polyarthritis, unspecified: Secondary | ICD-10-CM | POA: Insufficient documentation

## 2020-05-08 DIAGNOSIS — M5416 Radiculopathy, lumbar region: Secondary | ICD-10-CM | POA: Insufficient documentation

## 2020-05-08 DIAGNOSIS — N200 Calculus of kidney: Secondary | ICD-10-CM | POA: Insufficient documentation

## 2020-05-08 DIAGNOSIS — G5752 Tarsal tunnel syndrome, left lower limb: Secondary | ICD-10-CM | POA: Insufficient documentation

## 2020-05-09 ENCOUNTER — Encounter: Payer: Medicare HMO | Attending: "Endocrinology | Admitting: Nutrition

## 2020-05-09 ENCOUNTER — Other Ambulatory Visit: Payer: Self-pay

## 2020-05-09 VITALS — Ht 69.5 in | Wt 192.0 lb

## 2020-05-09 DIAGNOSIS — E1159 Type 2 diabetes mellitus with other circulatory complications: Secondary | ICD-10-CM | POA: Insufficient documentation

## 2020-05-09 DIAGNOSIS — I10 Essential (primary) hypertension: Secondary | ICD-10-CM

## 2020-05-09 DIAGNOSIS — E782 Mixed hyperlipidemia: Secondary | ICD-10-CM

## 2020-05-09 NOTE — Progress Notes (Signed)
Medical Nutrition Therapy  Appointment Start time:  8469 Appointment End time:  1630 This visit was completed via telephone due to the COVID-19 pandemic.   I spoke with Shelly Flatten and verified that I was speaking with the correct person with two patient identifiers (full name and date of birth).   I discussed the limitations related to this kind of visit and the patient is willing to proceed.  Primary concerns today: Diabetes Type 2 Referral diagnosis: e11.8 Preferred learning style:  no preference indicated Learning readiness:  ready, FBS:115-140 mg/dl. Bedtime 100-130's. . NUTRITION ASSESSMENT   Anthropometrics    Wt Readings from Last 3 Encounters:  05/09/20 192 lb (87.1 kg)  05/07/20 194 lb (88 kg)  05/02/20 195 lb (88.5 kg)   Ht Readings from Last 3 Encounters:  05/09/20 5' 9.5" (1.765 m)  05/07/20 5\' 9"  (1.753 m)  05/02/20 5\' 9"  (1.753 m)   Body mass index is 27.95 kg/m. @BMIFA @ Facility age limit for growth percentiles is 20 years. Facility age limit for growth percentiles is 20 years.   Clinical Medical Hx: HTN, Hyperlipidemia,  Medications: Actos, Labs:  Lab Results  Component Value Date   HGBA1C 6.9 05/07/2020   Notable Signs/Symptoms: none  Lifestyle & Dietary Hx   Estimated daily fluid intake: 24 oz Supplements:  Sleep: good  Stress / self-care: Current average weekly physical activity:   24-Hr Dietary Recall First Meal:  B) 2 eggs and coffee, L) coffee; skipped:  D) meat and some vegetables, water Drinks water, and green tea. Estimated Energy Needs Calories: 1800-2000 Carbohydrate:225 Protein:125 g Fat: 50   NUTRITION DIAGNOSIS  NB-1.1 Food and nutrition-related knowledge deficit As related to Diabetes.  As evidenced by A1C 6.8.   NUTRITION INTERVENTION  Nutrition education (E-1) on the following topics:  . Nutrition and Diabetes education provided on My Plate, CHO counting, meal planning, portion sizes, timing of meals, avoiding  snacks between meals unless having a low blood sugar, target ranges for A1C and blood sugars, signs/symptoms and treatment of hyper/hypoglycemia, monitoring blood sugars, taking medications as prescribed, benefits of exercising 30 minutes per day and prevention of complications of DM. [name]   Handouts Provided Include   The Plate Method  Diabetes Instrucitons.  Learning Style & Readiness for Change Teaching method utilized: Visual & Auditory  Demonstrated degree of understanding via: Teach Back  Barriers to learning/adherence to lifestyle change: none  Goals Established by Pt Goals  Follow My Plate Eat meals on time Do not skip meals Drink 80 oz of water per day Walk in house for exercise Eat 30-45 g carbs per meal  Increase fresh fruits and low carb vegetables with meals.   MONITORING & EVALUATION Dietary intake, weekly physical activity, and blood sugars  in 3 months.

## 2020-05-09 NOTE — Patient Instructions (Addendum)
Goals  Follow My Plate Eat meals on time Don't skip meals. Drink 80 oz of water per day Walk in house for exercise Eat 30-45 g carbs per meal  Increase fresh fruits and low carb vegetables with meals.

## 2020-05-15 ENCOUNTER — Other Ambulatory Visit: Payer: Self-pay

## 2020-05-15 ENCOUNTER — Encounter: Payer: Self-pay | Admitting: Nutrition

## 2020-05-23 ENCOUNTER — Encounter: Payer: Medicare HMO | Admitting: Nutrition

## 2020-06-21 ENCOUNTER — Other Ambulatory Visit: Payer: Self-pay

## 2020-06-21 MED ORDER — LISINOPRIL 5 MG PO TABS: 5.0000 mg | ORAL_TABLET | Freq: Every day | ORAL | 3 refills | Status: DC

## 2020-06-21 NOTE — Telephone Encounter (Signed)
Medication refill request approved and sent to pharmacy.  

## 2020-07-10 ENCOUNTER — Other Ambulatory Visit (HOSPITAL_COMMUNITY): Payer: Self-pay | Admitting: Neurology

## 2020-07-10 ENCOUNTER — Ambulatory Visit (HOSPITAL_COMMUNITY)
Admission: RE | Admit: 2020-07-10 | Discharge: 2020-07-10 | Disposition: A | Discharge status: Home / Self Care | Payer: Medicare HMO | Source: Ambulatory Visit | Attending: Neurology | Admitting: Neurology

## 2020-07-10 ENCOUNTER — Other Ambulatory Visit: Payer: Self-pay

## 2020-07-10 DIAGNOSIS — M25512 Pain in left shoulder: Secondary | ICD-10-CM | POA: Insufficient documentation

## 2020-07-10 DIAGNOSIS — M542 Cervicalgia: Secondary | ICD-10-CM | POA: Diagnosis present

## 2020-07-29 ENCOUNTER — Other Ambulatory Visit: Payer: Self-pay | Admitting: *Deleted

## 2020-07-29 MED ORDER — ATORVASTATIN CALCIUM 80 MG PO TABS
80.0000 mg | ORAL_TABLET | Freq: Every day | ORAL | 1 refills | Status: DC
Start: 2020-07-29 — End: 2021-02-06

## 2020-08-06 ENCOUNTER — Ambulatory Visit: Payer: Medicare Other | Admitting: Urology

## 2020-08-13 ENCOUNTER — Ambulatory Visit: Payer: Medicare HMO | Admitting: Urology

## 2020-09-04 ENCOUNTER — Ambulatory Visit: Payer: Medicare HMO | Admitting: "Endocrinology

## 2020-09-04 ENCOUNTER — Ambulatory Visit: Payer: Medicare HMO | Admitting: Nutrition

## 2020-09-25 ENCOUNTER — Ambulatory Visit: Payer: Medicare HMO | Admitting: "Endocrinology

## 2020-10-09 DIAGNOSIS — Z720 Tobacco use: Secondary | ICD-10-CM | POA: Insufficient documentation

## 2020-10-09 DIAGNOSIS — I2510 Atherosclerotic heart disease of native coronary artery without angina pectoris: Secondary | ICD-10-CM | POA: Insufficient documentation

## 2020-10-09 NOTE — Progress Notes (Deleted)
Cardiology Office Note   Date:  10/09/2020   ID:  Fae Blossom, DOB [birth year], MRN [id number]  PCP:  Dara Lords, NP  Cardiologist:   Minus Breeding, MD Referring:  ***  No chief complaint on file.     History of Present Illness: Sandy Meyer is a 75 y.o. female who presents for evaluation of coronary artery disease.  He had a stent to his LAD placed in 2001.  He was previously seen by Dr. Bronson Ing.  ***   *** with a history of CAD with previous LAD stent placed in 2001, HTN, HLD, tobacco abuse.  Last office visit with Dr. Bronson Ing 09/15/2019.  She denied any chest pain, palpitations, dyspnea or lower extremity edema.  She felt fatigued but was sure it was due to depression due to the recent death of her mother and her pet.  She was hypotensive and had held the evening dose of her lisinopril for several days to prior week.  She was hypertensive during clinic visit but denied dizziness or syncope.  She stated her heart rate had gotten into the 40 bpm range in the evenings on at least 2 occasions but she was asymptomatic.  She presented last vist with some complaints of left arm pain.  She stated she has had one episode recently She denied specific chest pain or shoulder pain as she had when she had prior stent in 2001.  She denied any specific anginal or exertional symptoms. She had been walking up to 16 miles per week without symptoms. No orthostatic symptoms, palpitations, CVA or TIA like symptoms, PND, orthopnea. She states blood pressures had been fluctuating. She stated sometimes the SBP could be in the mid 90's and as high as the 140's. After questioning she stated SBP has been averaging in the 120's to 130's. She denied any exertional angina or use of SL NTG. No complaints or orthostatic symptoms, palpitations, or arrhythmias, PND, orthopnea, CVA or TIA like symptoms, claudication like symptoms or LE edema.    Past Medical History:  Diagnosis Date   Anxiety     Arthritis    Blockage of coronary artery of heart (HCC)    CAD (coronary artery disease)    Cervical cancer (HCC)    Coronary artery disease    Depression    Diabetes (HCC)    Dyslipidemia    Hyperlipidemia    Hypertension    Joint pain    Kidney stones    MI (myocardial infarction) ([place])    Syncope     Past Surgical History:  Procedure Laterality Date   CESAREAN SECTION     complete hysterectomy     REPLACEMENT TOTAL KNEE       Current Outpatient Medications  Medication Sig Dispense Refill   amitriptyline (ELAVIL) 10 MG tablet Take 20 mg by mouth at bedtime.      aspirin 81 MG chewable tablet Chew 81 mg by mouth every evening.      atorvastatin (LIPITOR) 80 MG tablet Take 1 tablet (80 mg total) by mouth daily. 90 tablet 1   estradiol (VIVELLE-DOT) 0.05 MG/24HR patch Place 1 patch onto the skin every Sunday.      ezetimibe (ZETIA) 10 MG tablet Take 1 tablet (10 mg total) by mouth daily. 90 tablet 0   famotidine (PEPCID) 20 MG tablet Take 1 tablet (20 mg total) by mouth 2 (two) times daily. (Patient not taking: Reported on 05/09/2020) 30 tablet 0   fluconazole (DIFLUCAN) 150 MG tablet Take  150 mg by mouth daily as needed.     gabapentin (NEURONTIN) 300 MG capsule Take 300 mg by mouth 2 (two) times daily.  2   glucose blood (TRUE METRIX BLOOD GLUCOSE TEST) test strip Use as instructed to check blood glucose once a day. 100 strip 12   hydrochlorothiazide (HYDRODIURIL) 25 MG tablet TAKE 1 TABLET BY MOUTH TWICE A DAY 180 tablet 2   HYDROmorphone (DILAUDID) 2 MG tablet Take by mouth every 4 (four) hours as needed for severe pain. (Patient not taking: Reported on 05/09/2020)     icosapent Ethyl (VASCEPA) 1 g capsule Take 2 capsules (2 g total) by mouth 2 (two) times daily. (Patient not taking: Reported on 05/09/2020) 32 capsule 0   isosorbide mononitrate (IMDUR) 30 MG 24 hr tablet Take 1 tablet (30 mg total) by mouth daily. (Patient not taking: No sig reported) 90 tablet 1   Lancets MISC  1 each by Does not apply route daily. 100 each 3   latanoprost (XALATAN) 0.005 % ophthalmic solution Place 1 drop into both eyes at bedtime.  (Patient not taking: Reported on 05/09/2020)     lisinopril (ZESTRIL) 5 MG tablet Take 1 tablet (5 mg total) by mouth daily. 90 tablet 3   LORazepam (ATIVAN) 1 MG tablet Take 2 mg by mouth at bedtime.      nitroGLYCERIN (NITROSTAT) 0.4 MG SL tablet ONE TABLET UNDER TONGUE AS NEEDED FOR CHEST PAIN 25 tablet 3   Omega-3 Fatty Acids (FISH OIL) 1000 MG CAPS Take 1 capsule by mouth at bedtime.      pioglitazone (ACTOS) 15 MG tablet Take 15 mg by mouth daily.     potassium citrate (UROCIT-K) 10 MEQ (1080 MG) SR tablet Take 1 tablet (10 mEq total) by mouth 4 (four) times daily. 180 tablet 3   sertraline (ZOLOFT) 100 MG tablet Take 100 mg by mouth daily.     timolol (BETIMOL) 0.5 % ophthalmic solution Place 1 drop into both eyes daily.  (Patient not taking: Reported on 05/09/2020)     No current facility-administered medications for this visit.    Allergies:   Azithromycin, Lactose intolerance (gi), Sulfa antibiotics, Bactrim [sulfamethoxazole-trimethoprim], and Celebrex [celecoxib]    ROS:  Please see the history of present illness.   Otherwise, review of systems are positive for {NONE DEFAULTED:18576}.   All other systems are reviewed and negative.    PHYSICAL EXAM: VS:  There were no vitals taken for this visit. , BMI There is no height or weight on file to calculate BMI. GENERAL:  Well appearing NECK:  No jugular venous distention, waveform within normal limits, carotid upstroke brisk and symmetric, no bruits, no thyromegaly LUNGS:  Clear to auscultation bilaterally CHEST:  Unremarkable HEART:  PMI not displaced or sustained,S1 and S2 within normal limits, no S3, no S4, no clicks, no rubs, *** murmurs ABD:  Flat, positive bowel sounds normal in frequency in pitch, no bruits, no rebound, no guarding, no midline pulsatile mass, no hepatomegaly, no  splenomegaly EXT:  2 plus pulses throughout, no edema, no cyanosis no clubbing     ***GENERAL:  Well appearing HEENT:  Pupils equal round and reactive, fundi not visualized, oral mucosa unremarkable NECK:  No jugular venous distention, waveform within normal limits, carotid upstroke brisk and symmetric, no bruits, no thyromegaly LYMPHATICS:  No cervical, inguinal adenopathy LUNGS:  Clear to auscultation bilaterally BACK:  No CVA tenderness CHEST:  Unremarkable HEART:  PMI not displaced or sustained,S1 and S2 within normal limits,  no S3, no S4, no clicks, no rubs, *** murmurs ABD:  Flat, positive bowel sounds normal in frequency in pitch, no bruits, no rebound, no guarding, no midline pulsatile mass, no hepatomegaly, no splenomegaly EXT:  2 plus pulses throughout, no edema, no cyanosis no clubbing SKIN:  No rashes no nodules NEURO:  Cranial nerves II through XII grossly intact, motor grossly intact throughout PSYCH:  Cognitively intact, oriented to person place and time    EKG:  EKG {ACTION; IS/IS ZHG:[id number]} ordered today. The ekg ordered today demonstrates ***   Recent Labs: 03/12/2020: BUN 22; Creatinine, Ser 1.22; Hemoglobin 15.0; Platelets 223; Potassium 3.4; Sodium 136    Lipid Panel No results found for: CHOL, TRIG, HDL, CHOLHDL, VLDL, LDLCALC, LDLDIRECT    Wt Readings from Last 3 Encounters:  05/09/20 192 lb (87.1 kg)  05/07/20 194 lb (88 kg)  05/02/20 195 lb (88.5 kg)      Other studies Reviewed: Additional studies/ records that were reviewed today include: ***. Review of the above records demonstrates:  Please see elsewhere in the note.  ***   ASSESSMENT AND PLAN:  CAD in native artery/chest pain:  ***   Previous stenting of LAD 2001. Having  left arm pain x 1 episode described "as if someone took a wire and inserted it down my left arm". Not associated with exertion. No radiation or associated symptoms.  Today she states the arm pain in her left arm is  aggravated by attempted abduction of left arm to 90 degrees which causes more pain.  She states she has had issues with her shoulder and it feels like some type of arthritic condition.  She states she did not pursue the stress test due to remembering she had an adverse issue with the previous chemical stress test in the past tachycardia and low blood pressures.  Currently denies any anginal symptoms.  She states she stopped the Imdur due to vertigo.  Continue aspirin 81 mg daily.  Continue sublingual nitroglycerin as needed for chest pain.   Essential hypertension:  *** She states her blood pressures have been running low recently.  She states her systolic has been as low as 88.  She states she self adjusted her lisinopril down to 10 mg as needed.  Advised her to decrease the lisinopril to 5 mg p.o. daily.  We will send him a new prescription for this dosage.   Hyperlipidemia :  *** LDL goal <70 Lipid panel 07/06/2019 TG 235, TC 126, HDL 37, LDL 60. Continue Zetia 10 mg daily , Atorvastatin 80 mg daily.   Type 2 diabetes:  *** Patient states her diabetes not well controlled.  Recent hemoglobin A1c was 7.4% per her statement.  Patient states she has a pending appointment with Dr. Dorris Fetch on January 11 for management of her fluctuating blood sugars.   Smoking:  ***  Long history of smoking.  Patient states she smokes about 1/3 pack of cigarettes per day.  Highly advised her to cease smoking secondary to multiple predisposing factors for accelerating heart disease including hypertension, hyperlipidemia, type 2 diabetes.  Patient states she knows she needs to quit but finds it hard to stop.  Reinforced cessation.     Current medicines are reviewed at length with the patient today.  The patient {ACTIONS; HAS/DOES NOT HAVE:19233} concerns regarding medicines.  The following changes have been made:  {PLAN; NO CHANGE:13088:s}  Labs/ tests ordered today include: *** No orders of the defined types were placed  in this encounter.  Disposition:   FU with ***    Signed, Minus Breeding, MD  10/09/2020 10:07 PM    [facility]

## 2020-10-10 ENCOUNTER — Ambulatory Visit: Payer: Medicare HMO | Admitting: Cardiology

## 2020-10-29 ENCOUNTER — Other Ambulatory Visit: Payer: Self-pay

## 2020-10-29 DIAGNOSIS — N200 Calculus of kidney: Secondary | ICD-10-CM

## 2020-12-27 ENCOUNTER — Encounter: Payer: Self-pay | Admitting: Family Medicine

## 2020-12-27 ENCOUNTER — Ambulatory Visit: Payer: Medicare HMO | Admitting: Family Medicine

## 2020-12-27 ENCOUNTER — Other Ambulatory Visit: Payer: Self-pay

## 2020-12-27 VITALS — BP 102/69 | HR 99 | Ht 69.5 in | Wt 192.2 lb

## 2020-12-27 DIAGNOSIS — I2510 Atherosclerotic heart disease of native coronary artery without angina pectoris: Secondary | ICD-10-CM

## 2020-12-27 DIAGNOSIS — E785 Hyperlipidemia, unspecified: Secondary | ICD-10-CM

## 2020-12-27 DIAGNOSIS — I10 Essential (primary) hypertension: Secondary | ICD-10-CM

## 2020-12-27 DIAGNOSIS — F17200 Nicotine dependence, unspecified, uncomplicated: Secondary | ICD-10-CM

## 2020-12-27 DIAGNOSIS — E119 Type 2 diabetes mellitus without complications: Secondary | ICD-10-CM

## 2020-12-27 NOTE — Progress Notes (Signed)
Cardiology Office Note  Date: 12/27/2020   ID: Sandy Meyer, DOB 26-May-1945, MRN [id number]  PCP:  Sandy Lords, NP  Cardiologist:  None Electrophysiologist:  None   Chief Complaint: Follow-up CAD  History of Present Illness: Sandy Meyer is a 75 y.o. female with a history of CAD with previous LAD stent placed in 2001, HTN, HLD, tobacco abuse.  She is here for 59-monthfollow-up today.  She denies any acute illnesses or hospitalizations in the interim since last visit.  She states her blood pressure is not running in the low 100s range.  At previous visit she had self adjusted her lisinopril down and is currently taking lisinopril 5 mg daily.  Blood pressure today is 102/69.  She denies any anginal or exertional symptoms, orthostatic symptoms other than some mild transient dizziness when moving from a sitting to standing position.  No near syncopal or syncopal episodes.  Denies any DOE or SOB.  No PND or orthopnea.  No bleeding issues.  No CVA or TIA-like symptoms.  No claudication-like symptoms, DVT or PE-like symptoms, or lower extremity edema.  She continues to smoke she states she may use smoke 2 to 3 cigarettes/day mostly when she drinks coffee.  Current cardiac regimen includes aspirin 81 mg daily, atorvastatin 80 mg daily, Zetia 10 mg daily, HCTZ 25 mg p.o. twice daily, Imdur 30 mg daily, sublingual nitroglycerin as needed, lisinopril 5 mg daily.  She states it has been over a year since she has had any baseline lab work.  Past Medical History:  Diagnosis Date   Anxiety    Arthritis    Blockage of coronary artery of heart (HCC)    CAD (coronary artery disease)    Cervical cancer (HCC)    Coronary artery disease    Depression    Diabetes (HCC)    Dyslipidemia    Hyperlipidemia    Hypertension    Joint pain    Kidney stones    [place] infarction) [place]    Syncope     Past Surgical History:  Procedure Laterality Date   CESAREAN SECTION     complete  hysterectomy     REPLACEMENT TOTAL KNEE      Current Outpatient Medications  Medication Sig Dispense Refill   amitriptyline (ELAVIL) 10 MG tablet Take 20 mg by mouth at bedtime.      aspirin 81 MG chewable tablet Chew 81 mg by mouth every evening.      atorvastatin (LIPITOR) 80 MG tablet Take 1 tablet (80 mg total) by mouth daily. 90 tablet 1   estradiol (VIVELLE-DOT) 0.05 MG/24HR patch Place 1 patch onto the skin every Sunday.      ezetimibe (ZETIA) 10 MG tablet Take 1 tablet (10 mg total) by mouth daily. 90 tablet 0   famotidine (PEPCID) 20 MG tablet Take 1 tablet (20 mg total) by mouth 2 (two) times daily. 30 tablet 0   fluconazole (DIFLUCAN) 150 MG tablet Take 150 mg by mouth daily as needed.     gabapentin (NEURONTIN) 300 MG capsule Take 300 mg by mouth 2 (two) times daily.  2   glucose blood (TRUE METRIX BLOOD GLUCOSE TEST) test strip Use as instructed to check blood glucose once a day. 100 strip 12   hydrochlorothiazide (HYDRODIURIL) 25 MG tablet TAKE 1 TABLET BY MOUTH TWICE A DAY 180 tablet 2   HYDROmorphone (DILAUDID) 2 MG tablet Take by mouth every 4 (four) hours as needed for severe pain.  isosorbide mononitrate (IMDUR) 30 MG 24 hr tablet Take 1 tablet (30 mg total) by mouth daily. 90 tablet 1   Lancets MISC 1 each by Does not apply route daily. 100 each 3   latanoprost (XALATAN) 0.005 % ophthalmic solution Place 1 drop into both eyes at bedtime.     LORazepam (ATIVAN) 1 MG tablet Take 2 mg by mouth at bedtime.      nitroGLYCERIN (NITROSTAT) 0.4 MG SL tablet ONE TABLET UNDER TONGUE AS NEEDED FOR CHEST PAIN 25 tablet 3   Omega-3 Fatty Acids (FISH OIL) 1000 MG CAPS Take 1 capsule by mouth at bedtime.      pioglitazone (ACTOS) 15 MG tablet Take 15 mg by mouth daily.     potassium citrate (UROCIT-K) 10 MEQ (1080 MG) SR tablet Take 1 tablet (10 mEq total) by mouth 4 (four) times daily. 180 tablet 3   sertraline (ZOLOFT) 100 MG tablet Take 100 mg by mouth daily.     timolol  (BETIMOL) 0.5 % ophthalmic solution Place 1 drop into both eyes daily.     icosapent Ethyl (VASCEPA) 1 g capsule Take 2 capsules (2 g total) by mouth 2 (two) times daily. (Patient not taking: Reported on 12/27/2020) 32 capsule 0   lisinopril (ZESTRIL) 5 MG tablet Take 1 tablet (5 mg total) by mouth daily. 90 tablet 3   No current facility-administered medications for this visit.   Allergies:  Azithromycin, Lactose intolerance (gi), Sulfa antibiotics, Bactrim [sulfamethoxazole-trimethoprim], and Celebrex [celecoxib]   Social History: The patient  reports that she has been smoking cigarettes. She started smoking about 59 years ago. She has been smoking an average of .33 packs per day. She has never used smokeless tobacco. She reports that she does not currently use alcohol. She reports that she does not use drugs.   Family History: The patient's family history includes Diabetes in her brother and father; Fainting in her sister; Heart attack in her brother, father, mother, and sister; Heart disease in her brother, father, and mother; High Cholesterol in her brother, father, mother, and sister; Hypertension in her father, mother, and sister; Stroke in her brother; Sudden death in her father and mother.   ROS:  Please see the history of present illness. Otherwise, complete review of systems is positive for none.  All other systems are reviewed and negative.   Physical Exam: VS:  BP 102/69   Pulse 99   Ht 5' 9.5" (1.765 m)   Wt 192 lb 3.2 oz (87.2 kg)   SpO2 96%   BMI 27.98 kg/m , BMI Body mass index is 27.98 kg/m.  Wt Readings from Last 3 Encounters:  12/27/20 192 lb 3.2 oz (87.2 kg)  05/09/20 192 lb (87.1 kg)  05/07/20 194 lb (88 kg)    General: Patient appears comfortable at rest. Neck: Supple, no elevated JVP or carotid bruits, no thyromegaly. Lungs: Clear to auscultation, nonlabored breathing at rest. Cardiac: Regular rate and rhythm, no S3 or significant systolic murmur, no pericardial  rub. Extremities: No pitting edema, distal pulses 2+. Skin: Warm and dry. Musculoskeletal: No kyphosis. Neuropsychiatric: Alert and oriented x3, affect grossly appropriate.  ECG:  EKG 09/15/2019 NSR RSR' VI , V2, suggesting RV conduction delay  Recent Labwork: 03/12/2020: BUN 22; Creatinine, Ser 1.22; Hemoglobin 15.0; Platelets 223; Potassium 3.4; Sodium 136  No results found for: CHOL, TRIG, HDL, CHOLHDL, VLDL, LDLCALC, LDLDIRECT  Other Studies Reviewed Today:  Nuclear stress test on 11/10/16 was normal.   Echocardiogram demonstrated normal left  ventricular systolic function and regional wall motion, LVEF 60-65%. There was mild mitral regurgitation and grade indeterminate diastolic dysfunction.  Assessment and Plan:     1. CAD in native artery/chest pain Previous stenting of LAD 2001.  Denies any current anginal symptoms or nitroglycerin use.  Continue aspirin 81 mg daily, Imdur 30 mg daily, sublingual nitroglycerin as needed,   2. Essential hypertension She states her blood pressures have been running low recently.  Blood pressure today 102/79.  Continue lisinopril 5 mg p.o. daily.  Continue HCTZ 25 mg twice a day.  Please get basic metabolic panel and CBC.  3. Hyperlipidemia LDL goal <70 Lipid panel 07/06/2019 TG 235, TC 126, HDL 37, LDL 60. Continue Zetia 10 mg daily , Atorvastatin 80 mg daily.  Please get lipid panel   4.  Type 2 diabetes At last visit her diabetes was not well controlled and she was pending seeing Dr. Dorris Fetch.  Her last hemoglobin A1c was 6.9% on 05/07/2020.  She continues on Actos 15 mg daily.  He saw Dr. Dorris Fetch on 05/07/2020.  He ordered hemoglobin A1c, c-Met, lipid panel, T4 free and TSH, and ambulatory referral to medical nutrition therapy.  5.  Smoking Long history of smoking.  Patient states she is down to smoking about 1 to 3 cigarettes/day.  Encourage cessation.    Medication Adjustments/Labs and Tests Ordered: Current medicines are reviewed at length  with the patient today.  Concerns regarding medicines are outlined above.   Disposition: Follow-up with Dr Harl Bowie or APP 6 months Signed, Levell July, NP 12/27/2020 3:26 PM    [facility] [place], [place], [place] [address] Phone: [phone]; Fax: [phone]

## 2020-12-27 NOTE — Patient Instructions (Signed)
Medication Instructions:  Continue all current medications.  Labwork: CBC, BMET, FLP - orders given today.  Reminder:  Nothing to eat or drink after 12 midnight prior to labs. Office will contact with results via phone or letter.    Testing/Procedures: none  Follow-Up: 6 months   Any Other Special Instructions Will Be Listed Below (If Applicable).   If you need a refill on your cardiac medications before your next appointment, please call your pharmacy.

## 2021-01-07 ENCOUNTER — Encounter: Payer: Self-pay | Admitting: Urology

## 2021-01-07 ENCOUNTER — Other Ambulatory Visit: Payer: Self-pay

## 2021-01-07 ENCOUNTER — Ambulatory Visit (INDEPENDENT_AMBULATORY_CARE_PROVIDER_SITE_OTHER): Payer: Medicare HMO | Admitting: Urology

## 2021-01-07 VITALS — BP 106/72 | HR 79

## 2021-01-07 DIAGNOSIS — N200 Calculus of kidney: Secondary | ICD-10-CM | POA: Diagnosis not present

## 2021-01-07 LAB — URINALYSIS, ROUTINE W REFLEX MICROSCOPIC
Bilirubin, UA: NEGATIVE
Glucose, UA: NEGATIVE
Ketones, UA: NEGATIVE
Nitrite, UA: NEGATIVE
Protein,UA: NEGATIVE
RBC, UA: NEGATIVE
Specific Gravity, UA: 1.015 (ref 1.005–1.030)
Urobilinogen, Ur: 0.2 mg/dL (ref 0.2–1.0)
pH, UA: 7 (ref 5.0–7.5)

## 2021-01-07 LAB — MICROSCOPIC EXAMINATION: RBC, Urine: NONE SEEN /hpf (ref 0–2)

## 2021-01-07 NOTE — Progress Notes (Signed)
History of Present Illness: Sandy Meyer is a 75 y.o. year old female here for follow-up of urolithiasis.  She has a history of medullary sponge kidney and treatment for several stones in the past. Most of the time, she has needed ureteroscopy and stone extraction. Her last treatment was quite a few years ago. She states that she has had > 5 interventions for kidney stones.    She has hypercalciuria and hypocitraturia. She is maintained on potassium citrate 10 mEq 4 times a day as well as hydrochlorothiazide 25 mg twice a day. She last had imaging for her kidneys in 2015 by urologists in [place], [place]. She has had no recent flank pain or gross hematuria.    3.19.2019: 24 hour urine showed low volume 1.8L, low citrate at 375, low magnesium normal calcium.    1.21.2020: She is still on potassium citrate 40 mEq a day. She does have mild GI upset from that. She hasn't passed any stones over the past year.   9.13.2022: For the first time in a year and a half.  No stone episodes over that period of time.  She is still on her potassiums citrate.  We have not given her a refill-perhaps she gets from her primary care doctor, Dr. Huel Cote.  She does have urgency and frequency.  This is bothersome.  She does complain about what she thinks is fluid weight gain.    Past Medical History:  Diagnosis Date   Anxiety    Arthritis    Blockage of coronary artery of heart (HCC)    CAD (coronary artery disease)    Cervical cancer (HCC)    Coronary artery disease    Depression    Diabetes (HCC)    Dyslipidemia    Hyperlipidemia    Hypertension    Joint pain    Kidney stones    MI (myocardial infarction) ([place])    Syncope     Past Surgical History:  Procedure Laterality Date   CESAREAN SECTION     complete hysterectomy     REPLACEMENT TOTAL KNEE      Home Medications:  (Not in a hospital admission)   Allergies:  Allergies  Allergen Reactions   Azithromycin Other (See Comments)     unknown   Lactose Intolerance (Gi)    Sulfa Antibiotics Hives and Swelling   Bactrim [Sulfamethoxazole-Trimethoprim] Hives, Swelling and Rash   Celebrex [Celecoxib] Rash    Family History  Problem Relation Age of Onset   Heart disease Mother    Heart attack Mother    Hypertension Mother    High Cholesterol Mother    Sudden death Mother    Heart disease Father    Diabetes Father    Heart attack Father    High Cholesterol Father    Hypertension Father    Sudden death Father    38 Sister    Hypertension Sister    Heart attack Sister    High Cholesterol Sister    Heart disease Brother    Diabetes Brother    Heart attack Brother    High Cholesterol Brother    Stroke Brother     Social History:  reports that she has been smoking cigarettes. She started smoking about 59 years ago. She has been smoking an average of .33 packs per day. She has never used smokeless tobacco. She reports that she does not currently use alcohol. She reports that she does not use drugs.  ROS: A complete review of systems was  performed.  All systems are negative except for pertinent findings as noted.  Physical Exam:  Vital signs in last 24 hours: '@VSRANGES'$ @ General:  Alert and oriented, No acute distress HEENT: Normocephalic, atraumatic Neck: No JVD or lymphadenopathy Cardiovascular: Regular rate  Lungs: Normal inspiratory/expiratory excursion Extremities: No edema Neurologic: Grossly intact   I have reviewed prior pt notes  I have reviewed urinalysis results  I have independently reviewed prior imaging--last KUB from early 2020 revealed a 4 x 6 mm right lower pole and a 3 x 3 mm left interpolar calculus    Impression/Assessment:  1.  Overactive bladder, mildly to moderately bothersome  2.  History of urolithiasis with a stone in each kidney at last KUB 2-1/2 years ago  Plan:  1.  I will send her out for KUB  2.  Overactive bladder guide sheet given  3.  Continue potassiums  citrate 40 mill equivalents a day in divided doses  4.  I will see back in 1 year with KUB  Lillette Boxer Fawnda Vitullo 01/07/2021, 1:19 PM  Lillette Boxer. Johnte Portnoy MD

## 2021-01-07 NOTE — Progress Notes (Signed)
Urological Symptom Review  Patient is experiencing the following symptoms: Hard to postpone urination Leakage of urine   Review of Systems  Gastrointestinal (upper)  : Negative for upper GI symptoms  Gastrointestinal (lower) : Negative for lower GI symptoms  Constitutional : Negative for symptoms  Skin: Negative for skin symptoms  Eyes: Negative for eye symptoms  Ear/Nose/Throat : Negative for Ear/Nose/Throat symptoms  Hematologic/Lymphatic: Negative for Hematologic/Lymphatic symptoms  Cardiovascular : Negative for cardiovascular symptoms  Respiratory : Negative for respiratory symptoms  Endocrine: Negative for endocrine symptoms  Musculoskeletal: Negative for musculoskeletal symptoms  Neurological: Negative for neurological symptoms  Psychologic: Negative for psychiatric symptoms

## 2021-02-06 ENCOUNTER — Other Ambulatory Visit: Payer: Self-pay | Admitting: Family Medicine

## 2021-05-28 ENCOUNTER — Other Ambulatory Visit: Payer: Self-pay

## 2021-05-28 DIAGNOSIS — N200 Calculus of kidney: Secondary | ICD-10-CM

## 2021-05-28 MED ORDER — POTASSIUM CITRATE ER 10 MEQ (1080 MG) PO TBCR: 10.0000 meq | EXTENDED_RELEASE_TABLET | Freq: Four times a day (QID) | ORAL | 3 refills | Status: DC

## 2021-06-26 ENCOUNTER — Other Ambulatory Visit: Payer: Self-pay | Admitting: *Deleted

## 2021-06-27 MED ORDER — LISINOPRIL 5 MG PO TABS: 5.0000 mg | ORAL_TABLET | Freq: Every day | ORAL | 1 refills | Status: DC

## 2021-07-01 ENCOUNTER — Ambulatory Visit: Payer: Medicare HMO | Admitting: Cardiology

## 2021-07-01 ENCOUNTER — Encounter: Payer: Self-pay | Admitting: Cardiology

## 2021-07-01 VITALS — BP 100/60 | HR 74 | Ht 69.5 in | Wt 196.6 lb

## 2021-07-01 DIAGNOSIS — I10 Essential (primary) hypertension: Secondary | ICD-10-CM | POA: Diagnosis not present

## 2021-07-01 DIAGNOSIS — I2510 Atherosclerotic heart disease of native coronary artery without angina pectoris: Secondary | ICD-10-CM

## 2021-07-01 DIAGNOSIS — E782 Mixed hyperlipidemia: Secondary | ICD-10-CM | POA: Diagnosis not present

## 2021-07-01 NOTE — Progress Notes (Signed)
? ? ? ?Clinical Summary ?Ms. Rhinehart is a 76 y.o.female former patient of Dr Bronson Ing, this is our first visit together. ? ?CAD ?- history of stent to LAD in 2001, this was not in the setting of ACS ?- no recent chest pains. No SOB/DOE ?- compliant with meds ? ? ? ?2. HTN ?- compliant with meds ?- rare orthostatic symptoms ?- infrequent LE edema ? ? ? ?3. Hyperlipidemia ?-05/2021 TC 112 TG 141 HDL 42 LDL 46 ?- she is on atorvastatin 80, zetia '10mg'$  daily. ? ?Past Medical History:  ?Diagnosis Date  ? Anxiety   ? Arthritis   ? Blockage of coronary artery of heart (HCC)   ? CAD (coronary artery disease)   ? Cervical cancer ([place])   ? Coronary artery disease   ? Depression   ? Diabetes ([place])   ? Dyslipidemia   ? Hyperlipidemia   ? Hypertension   ? Joint pain   ? Kidney stones   ? MI (myocardial infarction) ([place])   ? Syncope   ? ? ? ?Allergies  ?Allergen Reactions  ? Azithromycin Other (See Comments)  ?  unknown  ? Lactose Intolerance (Gi)   ? Sulfa Antibiotics Hives and Swelling  ? Bactrim [Sulfamethoxazole-Trimethoprim] Hives, Swelling and Rash  ? Celebrex [Celecoxib] Rash  ? ? ? ?Current Outpatient Medications  ?Medication Sig Dispense Refill  ? amitriptyline (ELAVIL) 10 MG tablet Take 20 mg by mouth at bedtime.     ? aspirin 81 MG chewable tablet Chew 81 mg by mouth every evening.     ? atorvastatin (LIPITOR) 80 MG tablet TAKE 1 TABLET BY MOUTH EVERY DAY 90 tablet 1  ? estradiol (VIVELLE-DOT) 0.05 MG/24HR patch Place 1 patch onto the skin every Sunday.     ? ezetimibe (ZETIA) 10 MG tablet Take 1 tablet (10 mg total) by mouth daily. 90 tablet 0  ? famotidine (PEPCID) 20 MG tablet Take 1 tablet (20 mg total) by mouth 2 (two) times daily. 30 tablet 0  ? fluconazole (DIFLUCAN) 150 MG tablet Take 150 mg by mouth daily as needed.    ? gabapentin (NEURONTIN) 300 MG capsule Take 300 mg by mouth 2 (two) times daily.  2  ? glucose blood (TRUE METRIX BLOOD GLUCOSE TEST) test strip Use as instructed to check blood glucose once a  day. 100 strip 12  ? hydrochlorothiazide (HYDRODIURIL) 25 MG tablet TAKE 1 TABLET BY MOUTH TWICE A DAY 180 tablet 2  ? HYDROmorphone (DILAUDID) 2 MG tablet Take by mouth every 4 (four) hours as needed for severe pain.    ? Lancets MISC 1 each by Does not apply route daily. 100 each 3  ? latanoprost (XALATAN) 0.005 % ophthalmic solution Place 1 drop into both eyes at bedtime.    ? lisinopril (ZESTRIL) 5 MG tablet Take 1 tablet (5 mg total) by mouth daily. 90 tablet 1  ? LORazepam (ATIVAN) 1 MG tablet Take 2 mg by mouth at bedtime.     ? nitroGLYCERIN (NITROSTAT) 0.4 MG SL tablet ONE TABLET UNDER TONGUE AS NEEDED FOR CHEST PAIN 25 tablet 3  ? Omega-3 Fatty Acids (FISH OIL) 1000 MG CAPS Take 1 capsule by mouth at bedtime.     ? pioglitazone (ACTOS) 15 MG tablet Take 15 mg by mouth daily.    ? potassium citrate (UROCIT-K) 10 MEQ (1080 MG) SR tablet Take 1 tablet (10 mEq total) by mouth 4 (four) times daily. 180 tablet 3  ? sertraline (ZOLOFT) 100 MG tablet Take  100 mg by mouth daily.    ? timolol (BETIMOL) 0.5 % ophthalmic solution Place 1 drop into both eyes daily.    ? ?No current facility-administered medications for this visit.  ? ? ? ?Past Surgical History:  ?Procedure Laterality Date  ? CESAREAN SECTION    ? complete hysterectomy    ? REPLACEMENT TOTAL KNEE    ? ? ? ?Allergies  ?Allergen Reactions  ? Azithromycin Other (See Comments)  ?  unknown  ? Lactose Intolerance (Gi)   ? Sulfa Antibiotics Hives and Swelling  ? Bactrim [Sulfamethoxazole-Trimethoprim] Hives, Swelling and Rash  ? Celebrex [Celecoxib] Rash  ? ? ? ? ?Family History  ?Problem Relation Age of Onset  ? Heart disease Mother   ? Heart attack Mother   ? Hypertension Mother   ? High Cholesterol Mother   ? Sudden death Mother   ? Heart disease Father   ? Diabetes Father   ? Heart attack Father   ? High Cholesterol Father   ? Hypertension Father   ? Sudden death Father   ? Fainting Sister   ? Hypertension Sister   ? Heart attack Sister   ? High  Cholesterol Sister   ? Heart disease Brother   ? Diabetes Brother   ? Heart attack Brother   ? High Cholesterol Brother   ? Stroke Brother   ? ? ? ?Social History ?Ms. Camba reports that she has been smoking cigarettes. She started smoking about 60 years ago. She has been smoking an average of .33 packs per day. She has never used smokeless tobacco. ?Ms. Osuna reports that she does not currently use alcohol. ? ? ?Review of Systems ?CONSTITUTIONAL: No weight loss, fever, chills, weakness or fatigue.  ?HEENT: Eyes: No visual loss, blurred vision, double vision or yellow sclerae.No hearing loss, sneezing, congestion, runny nose or sore throat.  ?SKIN: No rash or itching.  ?CARDIOVASCULAR: per hpi ?RESPIRATORY: No shortness of breath, cough or sputum.  ?GASTROINTESTINAL: No anorexia, nausea, vomiting or diarrhea. No abdominal pain or blood.  ?GENITOURINARY: No burning on urination, no polyuria ?NEUROLOGICAL: No headache, dizziness, syncope, paralysis, ataxia, numbness or tingling in the extremities. No change in bowel or bladder control.  ?MUSCULOSKELETAL: No muscle, back pain, joint pain or stiffness.  ?LYMPHATICS: No enlarged nodes. No history of splenectomy.  ?PSYCHIATRIC: No history of depression or anxiety.  ?ENDOCRINOLOGIC: No reports of sweating, cold or heat intolerance. No polyuria or polydipsia.  ?. ? ? ?Physical Examination ?Today's Vitals  ? 07/01/21 1422  ?BP: 100/60  ?Pulse: 74  ?SpO2: 98%  ?Weight: 196 lb 9.6 oz (89.2 kg)  ?Height: 5' 9.5" (1.765 m)  ? ?Body mass index is 28.62 kg/m?. ? ?Gen: resting comfortably, no acute distress ?HEENT: no scleral icterus, pupils equal round and reactive, no palptable cervical adenopathy,  ?CV: RRR, no m/r/g no jvd ?Resp: Clear to auscultation bilaterally ?GI: abdomen is soft, non-tender, non-distended, normal bowel sounds, no hepatosplenomegaly ?MSK: extremities are warm, no edema.  ?Skin: warm, no rash ?Neuro:  no focal deficits ?Psych: appropriate  affect ? ? ?Diagnostic Studies ? ?Nuclear stress test on 11/10/16 was normal. ?  ?10/2016 Echocardiogram demonstrated normal left ventricular systolic function and regional wall motion, LVEF 60-65%. There was mild mitral regurgitation and grade indeterminate diastolic dysfunction. ? ? ? ? ? ?Assessment and Plan  ?1.CAD ?- no recent symptoms, continue current meds ?- EKG shows NSR, no ischemic changes ? ?2. HTN ?- at goal, continue current meds ? ?3. Hyperlipidemia ?-  at goal, continue current meds ? ? ?F/u 1 year ? ? ?Arnoldo Lenis, M.D. ?

## 2021-07-01 NOTE — Patient Instructions (Signed)
Medication Instructions:  ?Continue all current medications. ? ?Labwork: ?none ? ?Testing/Procedures: ?none ? ?Follow-Up: ? Your physician wants you to follow up in:  1 year.  You will receive a call from the office.   ? ?Any Other Special Instructions Will Be Listed Below (If Applicable). ? ? ?If you need a refill on your cardiac medications before your next appointment, please call your pharmacy. ? ?

## 2021-07-08 ENCOUNTER — Encounter (INDEPENDENT_AMBULATORY_CARE_PROVIDER_SITE_OTHER): Payer: Medicare HMO | Admitting: Ophthalmology

## 2021-07-08 ENCOUNTER — Other Ambulatory Visit: Payer: Self-pay

## 2021-07-08 DIAGNOSIS — H35371 Puckering of macula, right eye: Secondary | ICD-10-CM

## 2021-07-08 DIAGNOSIS — H35033 Hypertensive retinopathy, bilateral: Secondary | ICD-10-CM

## 2021-07-08 DIAGNOSIS — H2513 Age-related nuclear cataract, bilateral: Secondary | ICD-10-CM

## 2021-07-08 DIAGNOSIS — H43813 Vitreous degeneration, bilateral: Secondary | ICD-10-CM | POA: Diagnosis not present

## 2021-07-08 DIAGNOSIS — I10 Essential (primary) hypertension: Secondary | ICD-10-CM | POA: Diagnosis not present

## 2021-08-01 ENCOUNTER — Other Ambulatory Visit: Payer: Self-pay

## 2021-08-01 MED ORDER — ATORVASTATIN CALCIUM 80 MG PO TABS: 80.0000 mg | ORAL_TABLET | Freq: Every day | ORAL | 1 refills | Status: DC

## 2021-08-14 DIAGNOSIS — I249 Acute ischemic heart disease, unspecified: Secondary | ICD-10-CM | POA: Insufficient documentation

## 2021-08-14 DIAGNOSIS — I2089 Other forms of angina pectoris: Secondary | ICD-10-CM | POA: Insufficient documentation

## 2021-09-30 ENCOUNTER — Ambulatory Visit: Payer: Medicare HMO | Admitting: Cardiology

## 2021-09-30 NOTE — Progress Notes (Deleted)
Clinical Summary Ms. Bayless is a 76 y.o.female  CAD - history of stent to LAD in 2001, this was not in the setting of ACS - no recent chest pains. No SOB/DOE - compliant with meds    07/2021 admit [facility] with chest pain - ruled out for ACS -  07/2021 echo LVEF 60-65%, grade I dd - 07/2021 nuclear stress: small inferolateral scar, no ischemia     2. HTN - compliant with meds - rare orthostatic symptoms - infrequent LE edema       3. Hyperlipidemia -05/2021 TC 112 TG 141 HDL 42 LDL 46 - she is on atorvastatin 80, zetia '10mg'$  daily. Past Medical History:  Diagnosis Date   Anxiety    Arthritis    Blockage of coronary artery of heart (HCC)    CAD (coronary artery disease)    Cervical cancer (HCC)    Coronary artery disease    Depression    Diabetes (HCC)    Dyslipidemia    Hyperlipidemia    Hypertension    Joint pain    Kidney stones    MI (myocardial infarction) ([place])    Syncope      Allergies  Allergen Reactions   Azithromycin Other (See Comments)    unknown   Lactose Intolerance (Gi)    Sulfa Antibiotics Hives and Swelling   Bactrim [Sulfamethoxazole-Trimethoprim] Hives, Swelling and Rash   Celebrex [Celecoxib] Rash     Current Outpatient Medications  Medication Sig Dispense Refill   amitriptyline (ELAVIL) 10 MG tablet Take 20 mg by mouth at bedtime.      aspirin 81 MG chewable tablet Chew 81 mg by mouth every evening.      atorvastatin (LIPITOR) 80 MG tablet Take 1 tablet (80 mg total) by mouth daily. 90 tablet 1   estradiol (VIVELLE-DOT) 0.05 MG/24HR patch Place 1 patch onto the skin every Sunday.      ezetimibe (ZETIA) 10 MG tablet Take 1 tablet (10 mg total) by mouth daily. 90 tablet 0   famotidine (PEPCID) 20 MG tablet Take 1 tablet (20 mg total) by mouth 2 (two) times daily. 30 tablet 0   fluconazole (DIFLUCAN) 150 MG tablet Take 150 mg by mouth daily as needed.     gabapentin (NEURONTIN) 300 MG capsule Take 300 mg by mouth 2 (two) times  daily.  2   glucose blood (TRUE METRIX BLOOD GLUCOSE TEST) test strip Use as instructed to check blood glucose once a day. 100 strip 12   hydrochlorothiazide (HYDRODIURIL) 25 MG tablet TAKE 1 TABLET BY MOUTH TWICE A DAY 180 tablet 2   HYDROmorphone (DILAUDID) 2 MG tablet Take by mouth every 4 (four) hours as needed for severe pain.     Lancets MISC 1 each by Does not apply route daily. 100 each 3   latanoprost (XALATAN) 0.005 % ophthalmic solution Place 1 drop into both eyes at bedtime.     lisinopril (ZESTRIL) 5 MG tablet Take 1 tablet (5 mg total) by mouth daily. 90 tablet 1   LORazepam (ATIVAN) 1 MG tablet Take 2 mg by mouth at bedtime.      nitroGLYCERIN (NITROSTAT) 0.4 MG SL tablet ONE TABLET UNDER TONGUE AS NEEDED FOR CHEST PAIN 25 tablet 3   Omega-3 Fatty Acids (FISH OIL) 1000 MG CAPS Take 1 capsule by mouth at bedtime.      pioglitazone (ACTOS) 15 MG tablet Take 15 mg by mouth daily.     potassium citrate (UROCIT-K) 10 MEQ (  1080 MG) SR tablet Take 1 tablet (10 mEq total) by mouth 4 (four) times daily. 180 tablet 3   sertraline (ZOLOFT) 100 MG tablet Take 100 mg by mouth daily.     timolol (BETIMOL) 0.5 % ophthalmic solution Place 1 drop into both eyes daily.     No current facility-administered medications for this visit.     Past Surgical History:  Procedure Laterality Date   CESAREAN SECTION     complete hysterectomy     REPLACEMENT TOTAL KNEE       Allergies  Allergen Reactions   Azithromycin Other (See Comments)    unknown   Lactose Intolerance (Gi)    Sulfa Antibiotics Hives and Swelling   Bactrim [Sulfamethoxazole-Trimethoprim] Hives, Swelling and Rash   Celebrex [Celecoxib] Rash      Family History  Problem Relation Age of Onset   Heart disease Mother    Heart attack Mother    Hypertension Mother    High Cholesterol Mother    Sudden death Mother    Heart disease Father    Diabetes Father    Heart attack Father    High Cholesterol Father    Hypertension  Father    Sudden death Father    Fainting Sister    Hypertension Sister    Heart attack Sister    High Cholesterol Sister    Heart disease Brother    Diabetes Brother    Heart attack Brother    High Cholesterol Brother    Stroke Brother      Social History Ms. Ringley reports that she has been smoking cigarettes. She started smoking about 60 years ago. She has been smoking an average of .33 packs per day. She has never used smokeless tobacco. Ms. Strain reports that she does not currently use alcohol.   Review of Systems CONSTITUTIONAL: No weight loss, fever, chills, weakness or fatigue.  HEENT: Eyes: No visual loss, blurred vision, double vision or yellow sclerae.No hearing loss, sneezing, congestion, runny nose or sore throat.  SKIN: No rash or itching.  CARDIOVASCULAR:  RESPIRATORY: No shortness of breath, cough or sputum.  GASTROINTESTINAL: No anorexia, nausea, vomiting or diarrhea. No abdominal pain or blood.  GENITOURINARY: No burning on urination, no polyuria NEUROLOGICAL: No headache, dizziness, syncope, paralysis, ataxia, numbness or tingling in the extremities. No change in bowel or bladder control.  MUSCULOSKELETAL: No muscle, back pain, joint pain or stiffness.  LYMPHATICS: No enlarged nodes. No history of splenectomy.  PSYCHIATRIC: No history of depression or anxiety.  ENDOCRINOLOGIC: No reports of sweating, cold or heat intolerance. No polyuria or polydipsia.  [name]   Physical Examination There were no vitals filed for this visit. There were no vitals filed for this visit.  Gen: resting comfortably, no acute distress HEENT: no scleral icterus, pupils equal round and reactive, no palptable cervical adenopathy,  CV Resp: Clear to auscultation bilaterally GI: abdomen is soft, non-tender, non-distended, normal bowel sounds, no hepatosplenomegaly MSK: extremities are warm, no edema.  Skin: warm, no rash Neuro:  no focal deficits Psych: appropriate  affect   Diagnostic Studies Nuclear stress test on 11/10/16 was normal.   10/2016 Echocardiogram demonstrated normal left ventricular systolic function and regional wall motion, LVEF 60-65%. There was mild mitral regurgitation and grade indeterminate diastolic dysfunction.    Assessment and Plan  1.CAD - no recent symptoms, continue current meds - EKG shows NSR, no ischemic changes   2. HTN - at goal, continue current meds   3. Hyperlipidemia - at  goal, continue current meds      Arnoldo Lenis, M.D., F.A.C.C.

## 2021-12-30 ENCOUNTER — Ambulatory Visit: Payer: Medicare HMO | Admitting: Urology

## 2022-01-06 ENCOUNTER — Ambulatory Visit: Payer: Medicare HMO | Admitting: Urology

## 2022-01-08 ENCOUNTER — Encounter (INDEPENDENT_AMBULATORY_CARE_PROVIDER_SITE_OTHER): Payer: Medicare HMO | Admitting: Ophthalmology

## 2022-01-27 DIAGNOSIS — Z8544 Personal history of malignant neoplasm of other female genital organs: Secondary | ICD-10-CM | POA: Insufficient documentation

## 2022-01-27 DIAGNOSIS — N898 Other specified noninflammatory disorders of vagina: Secondary | ICD-10-CM | POA: Insufficient documentation

## 2022-01-30 ENCOUNTER — Other Ambulatory Visit: Payer: Self-pay | Admitting: Student

## 2022-02-03 ENCOUNTER — Ambulatory Visit: Payer: Medicare HMO | Admitting: Urology

## 2022-02-03 DIAGNOSIS — N200 Calculus of kidney: Secondary | ICD-10-CM

## 2022-02-12 ENCOUNTER — Ambulatory Visit: Payer: Medicare HMO | Admitting: Cardiology

## 2022-02-12 NOTE — Progress Notes (Deleted)
Clinical Summary Sandy Meyer is a 76 y.o.female  CAD - history of stent to LAD in 2001, this was not in the setting of ACS - no recent chest pains. No SOB/DOE - compliant with meds    07/2021 admit [facility] with chest pain. - enzyems neg, no acute EKG chagnes - 07/2021 echo: Echocardiogram with normal LVEF, grade 1 diastolic dysfunction, normal IVC size and inspiratory change, normal right atrial pressure - 07/2021 nuclear stress: no ischemia   2. HTN - compliant with meds - rare orthostatic symptoms - infrequent LE edema       3. Hyperlipidemia -05/2021 TC 112 TG 141 HDL 42 LDL 46 - she is on atorvastatin 80, zetia '10mg'$  daily.   Past Medical History:  Diagnosis Date   Anxiety    Arthritis    Blockage of coronary artery of heart (HCC)    CAD (coronary artery disease)    Cervical cancer (HCC)    Coronary artery disease    Depression    Diabetes (HCC)    Dyslipidemia    Hyperlipidemia    Hypertension    Joint pain    Kidney stones    MI (myocardial infarction) ([place])    Syncope      Allergies  Allergen Reactions   Azithromycin Other (See Comments)    unknown   Lactose Intolerance (Gi)    Sulfa Antibiotics Hives and Swelling   Bactrim [Sulfamethoxazole-Trimethoprim] Hives, Swelling and Rash   Celebrex [Celecoxib] Rash     Current Outpatient Medications  Medication Sig Dispense Refill   amitriptyline (ELAVIL) 10 MG tablet Take 20 mg by mouth at bedtime.      aspirin 81 MG chewable tablet Chew 81 mg by mouth every evening.      atorvastatin (LIPITOR) 80 MG tablet Take 1 tablet (80 mg total) by mouth daily. 90 tablet 1   estradiol (VIVELLE-DOT) 0.05 MG/24HR patch Place 1 patch onto the skin every Sunday.      ezetimibe (ZETIA) 10 MG tablet Take 1 tablet (10 mg total) by mouth daily. 90 tablet 0   famotidine (PEPCID) 20 MG tablet Take 1 tablet (20 mg total) by mouth 2 (two) times daily. 30 tablet 0   fluconazole (DIFLUCAN) 150 MG tablet Take 150 mg by mouth  daily as needed.     gabapentin (NEURONTIN) 300 MG capsule Take 300 mg by mouth 2 (two) times daily.  2   glucose blood (TRUE METRIX BLOOD GLUCOSE TEST) test strip Use as instructed to check blood glucose once a day. 100 strip 12   hydrochlorothiazide (HYDRODIURIL) 25 MG tablet TAKE 1 TABLET BY MOUTH TWICE A DAY 180 tablet 2   HYDROmorphone (DILAUDID) 2 MG tablet Take by mouth every 4 (four) hours as needed for severe pain.     Lancets MISC 1 each by Does not apply route daily. 100 each 3   latanoprost (XALATAN) 0.005 % ophthalmic solution Place 1 drop into both eyes at bedtime.     lisinopril (ZESTRIL) 5 MG tablet TAKE 1 TABLET (5 MG TOTAL) BY MOUTH DAILY. 90 tablet 1   LORazepam (ATIVAN) 1 MG tablet Take 2 mg by mouth at bedtime.      nitroGLYCERIN (NITROSTAT) 0.4 MG SL tablet ONE TABLET UNDER TONGUE AS NEEDED FOR CHEST PAIN 25 tablet 3   Omega-3 Fatty Acids (FISH OIL) 1000 MG CAPS Take 1 capsule by mouth at bedtime.      pioglitazone (ACTOS) 15 MG tablet Take 15 mg  by mouth daily.     potassium citrate (UROCIT-K) 10 MEQ (1080 MG) SR tablet Take 1 tablet (10 mEq total) by mouth 4 (four) times daily. 180 tablet 3   sertraline (ZOLOFT) 100 MG tablet Take 100 mg by mouth daily.     timolol (BETIMOL) 0.5 % ophthalmic solution Place 1 drop into both eyes daily.     No current facility-administered medications for this visit.     Past Surgical History:  Procedure Laterality Date   CESAREAN SECTION     complete hysterectomy     REPLACEMENT TOTAL KNEE       Allergies  Allergen Reactions   Azithromycin Other (See Comments)    unknown   Lactose Intolerance (Gi)    Sulfa Antibiotics Hives and Swelling   Bactrim [Sulfamethoxazole-Trimethoprim] Hives, Swelling and Rash   Celebrex [Celecoxib] Rash      Family History  Problem Relation Age of Onset   Heart disease Mother    Heart attack Mother    Hypertension Mother    High Cholesterol Mother    Sudden death Mother    Heart disease  Father    Diabetes Father    Heart attack Father    High Cholesterol Father    Hypertension Father    Sudden death Father    Fainting Sister    Hypertension Sister    Heart attack Sister    High Cholesterol Sister    Heart disease Brother    Diabetes Brother    Heart attack Brother    High Cholesterol Brother    Stroke Brother      Social History [name] reports that she has been smoking cigarettes. She started smoking about 61 years ago. She has been smoking an average of .33 packs per day. She has never used smokeless tobacco. [name] reports that she does not currently use alcohol.   Review of Systems CONSTITUTIONAL: No weight loss, fever, chills, weakness or fatigue.  HEENT: Eyes: No visual loss, blurred vision, double vision or yellow sclerae.No hearing loss, sneezing, congestion, runny nose or sore throat.  SKIN: No rash or itching.  CARDIOVASCULAR:  RESPIRATORY: No shortness of breath, cough or sputum.  GASTROINTESTINAL: No anorexia, nausea, vomiting or diarrhea. No abdominal pain or blood.  GENITOURINARY: No burning on urination, no polyuria NEUROLOGICAL: No headache, dizziness, syncope, paralysis, ataxia, numbness or tingling in the extremities. No change in bowel or bladder control.  MUSCULOSKELETAL: No muscle, back pain, joint pain or stiffness.  LYMPHATICS: No enlarged nodes. No history of splenectomy.  PSYCHIATRIC: No history of depression or anxiety.  ENDOCRINOLOGIC: No reports of sweating, cold or heat intolerance. No polyuria or polydipsia.  [name]   Physical Examination There were no vitals filed for this visit. There were no vitals filed for this visit.  Gen: resting comfortably, no acute distress HEENT: no scleral icterus, pupils equal round and reactive, no palptable cervical adenopathy,  CV Resp: Clear to auscultation bilaterally GI: abdomen is soft, non-tender, non-distended, normal bowel sounds, no hepatosplenomegaly MSK: extremities are warm,  no edema.  Skin: warm, no rash Neuro:  no focal deficits Psych: appropriate affect   Diagnostic Studies  Nuclear stress test on 11/10/16 was normal.   10/2016 Echocardiogram demonstrated normal left ventricular systolic function and regional wall motion, LVEF 60-65%. There was mild mitral regurgitation and grade indeterminate diastolic dysfunction.   07/2021 echo [facility]  Summary    1. The left ventricle is normal in size with normal wall thickness.  2. The left ventricular systolic function is normal, LVEF is visually  estimated at 60-65%.    3. There is grade I diastolic dysfunction (impaired relaxation).    4. The left atrium is mildly dilated in size.    5. The right ventricle is normal in size, with normal systolic function.    07/2021 nuclear stress 1. Small fixed defect in the inferolateral apex consistent with  small scar. No evidence reversible ischemia..   2. No LEFT ventricular dilatation. Ejection fraction not assessed.  Patient unable to be gated.   3. Non invasive risk stratification*: Low     Assessment and Plan  1.CAD - no recent symptoms, continue current meds - EKG shows NSR, no ischemic changes   2. HTN - at goal, continue current meds   3. Hyperlipidemia - at goal, continue current meds      Arnoldo Lenis, M.D., F.A.C.C.

## 2022-02-17 ENCOUNTER — Ambulatory Visit: Payer: Medicare HMO | Admitting: Urology

## 2022-02-17 ENCOUNTER — Encounter: Payer: Self-pay | Admitting: Urology

## 2022-02-17 ENCOUNTER — Ambulatory Visit (HOSPITAL_COMMUNITY)
Admission: RE | Admit: 2022-02-17 | Discharge: 2022-02-17 | Disposition: A | Discharge status: Home / Self Care | Payer: Medicare HMO | Source: Ambulatory Visit | Attending: Urology | Admitting: Urology

## 2022-02-17 ENCOUNTER — Ambulatory Visit (INDEPENDENT_AMBULATORY_CARE_PROVIDER_SITE_OTHER): Payer: Medicare HMO | Admitting: Urology

## 2022-02-17 VITALS — BP 120/77 | HR 75 | Temp 98.6°F | Ht 69.0 in | Wt 194.6 lb

## 2022-02-17 DIAGNOSIS — N200 Calculus of kidney: Secondary | ICD-10-CM | POA: Diagnosis present

## 2022-02-17 DIAGNOSIS — Z87442 Personal history of urinary calculi: Secondary | ICD-10-CM

## 2022-02-17 DIAGNOSIS — A5901 Trichomonal vulvovaginitis: Secondary | ICD-10-CM | POA: Diagnosis not present

## 2022-02-17 MED ORDER — METRONIDAZOLE 500 MG PO TABS: ORAL_TABLET | ORAL | 0 refills | Status: DC

## 2022-02-17 NOTE — Progress Notes (Signed)
History of Present Illness: Sandy Meyer is a 76 y.o. year old female   She has a history of medullary sponge kidney and treatment for several stones in the past. Most of the time, she has needed ureteroscopic management.  She states that she has had > 5 interventions for kidney stones.    She has hypercalciuria and hypocitraturia. She is maintained on potassium citrate 10 mEq 4 times a day as well as hydrochlorothiazide 25 mg twice a day.    3.19.2019: 24 hour urine showed low volume 1.8L, low citrate at 375, low magnesium normal calcium.    9.13.2022: Seen for follow-up.  KUB ordered, patient did not have that done.  10.24.2023: Here for annual follow-up.  She has had no stone episodes over the past year.  She is still on potassiums citrate 40 mill equivalents in divided doses throughout the day.  Biggest complaint is vaginal discharge that has been going on for a couple of months.  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  She is not sexually active.  Past Medical History:  Diagnosis Date   Anxiety    Arthritis    Blockage of coronary artery of heart (HCC)    CAD (coronary artery disease)    Cervical cancer (HCC)    Coronary artery disease    Depression    Diabetes (HCC)    Dyslipidemia    Hyperlipidemia    Hypertension    Joint pain    Kidney stones    MI (myocardial infarction) ([place])    Syncope     Past Surgical History:  Procedure Laterality Date   CESAREAN SECTION     complete hysterectomy     REPLACEMENT TOTAL KNEE      Home Medications:  (Not in a hospital admission)   Allergies:  Allergies  Allergen Reactions   Azithromycin Other (See Comments)    unknown   Lactose Intolerance (Gi)    Sulfa Antibiotics Hives and Swelling   Bactrim [Sulfamethoxazole-Trimethoprim] Hives, Swelling and Rash   Celebrex [Celecoxib] Rash    Family History  Problem Relation Age of Onset   Heart disease Mother    Heart attack  Mother    Hypertension Mother    High Cholesterol Mother    Sudden death Mother    Heart disease Father    Diabetes Father    Heart attack Father    High Cholesterol Father    Hypertension Father    Sudden death Father    64 Sister    Hypertension Sister    Heart attack Sister    High Cholesterol Sister    Heart disease Brother    Diabetes Brother    Heart attack Brother    High Cholesterol Brother    Stroke Brother     Social History:  reports that she has been smoking cigarettes. She started smoking about 61 years ago. She has been smoking an average of .33 packs per day. She has never used smokeless tobacco. She reports that she does not currently use alcohol. She reports that she does not use drugs.  ROS: A complete review of systems was performed.  All systems are negative except for pertinent findings as noted.  Physical Exam:  Vital signs in last 24 hours: '@VSRANGES'$ @ General:  Alert and oriented, No acute distress HEENT: Normocephalic, atraumatic Neck: No JVD or lymphadenopathy Cardiovascular: Regular rate  Lungs: Normal inspiratory/expiratory excursion Extremities: No edema Neurologic: Grossly intact  I have reviewed  prior pt notes  I have reviewed notes from referring/previous physicians--[facility]  I have reviewed urinalysis results  I reviewed the patient's vaginal prep results from [place].  She did have positive findings for trichomonas  Impression/Assessment:  1.  History of urolithiasis.  On potassium citrate.  No real stone episodes  2.  Possible trichomonal vaginitis  Plan:  1.  I reassured her about her discharge, this may be benign rather than malignant  2.  She was treated with 2 g of metronidazole  3.  KUB today  4.  I will see her back in 1 year  Jorja Loa 02/17/2022, 9:04 AM  Lillette Boxer. Victoria Henshaw MD

## 2022-02-18 LAB — URINALYSIS, ROUTINE W REFLEX MICROSCOPIC
Bilirubin, UA: NEGATIVE
Glucose, UA: NEGATIVE
Ketones, UA: NEGATIVE
Nitrite, UA: NEGATIVE
Protein,UA: NEGATIVE
RBC, UA: NEGATIVE
Specific Gravity, UA: 1.015 (ref 1.005–1.030)
Urobilinogen, Ur: 0.2 mg/dL (ref 0.2–1.0)
pH, UA: 7 (ref 5.0–7.5)

## 2022-02-18 LAB — MICROSCOPIC EXAMINATION

## 2022-03-27 ENCOUNTER — Other Ambulatory Visit: Payer: Self-pay | Admitting: Cardiology

## 2022-05-15 IMAGING — DX DG CERVICAL SPINE COMPLETE 4+V
6 series · 6 of 6 positions shown · non-contrast
Comparison: None.

CLINICAL DATA: Neck pain

EXAM:
CERVICAL SPINE - COMPLETE 4+ VIEW

[c-spine lat]
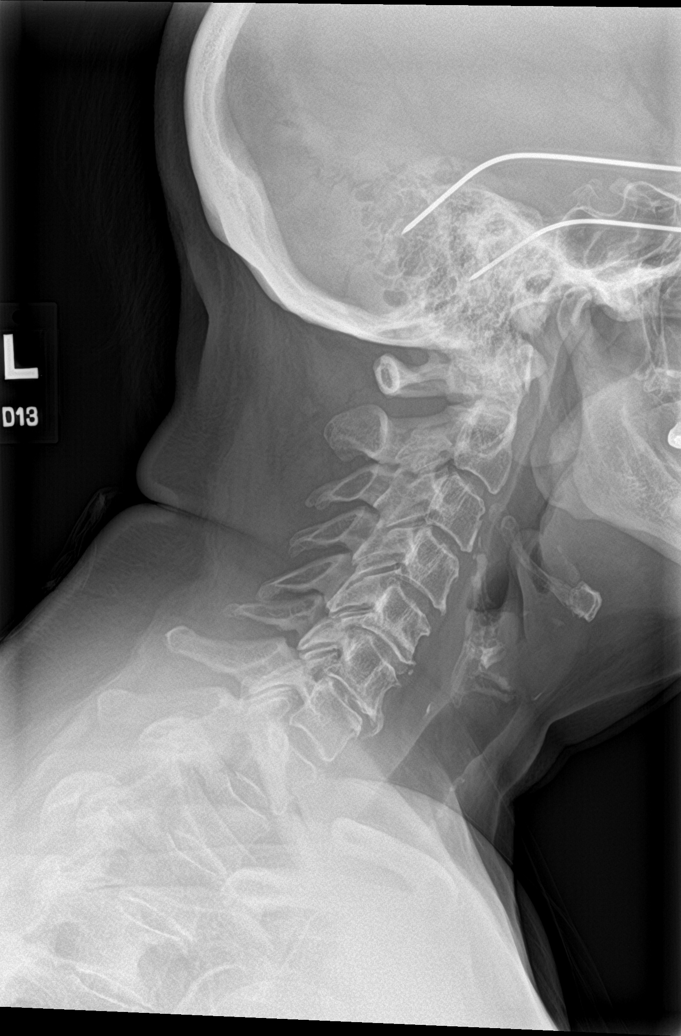

[c-spine obl (1 of 2)]
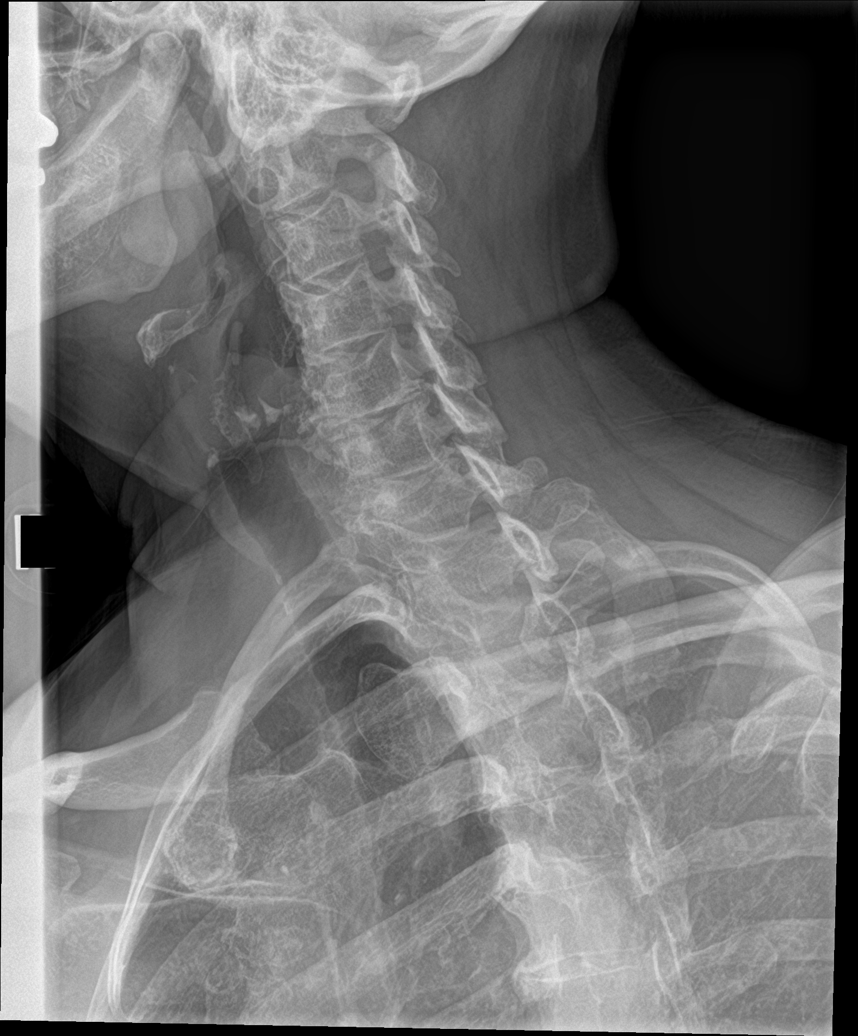

[c-spine obl (2 of 2)]
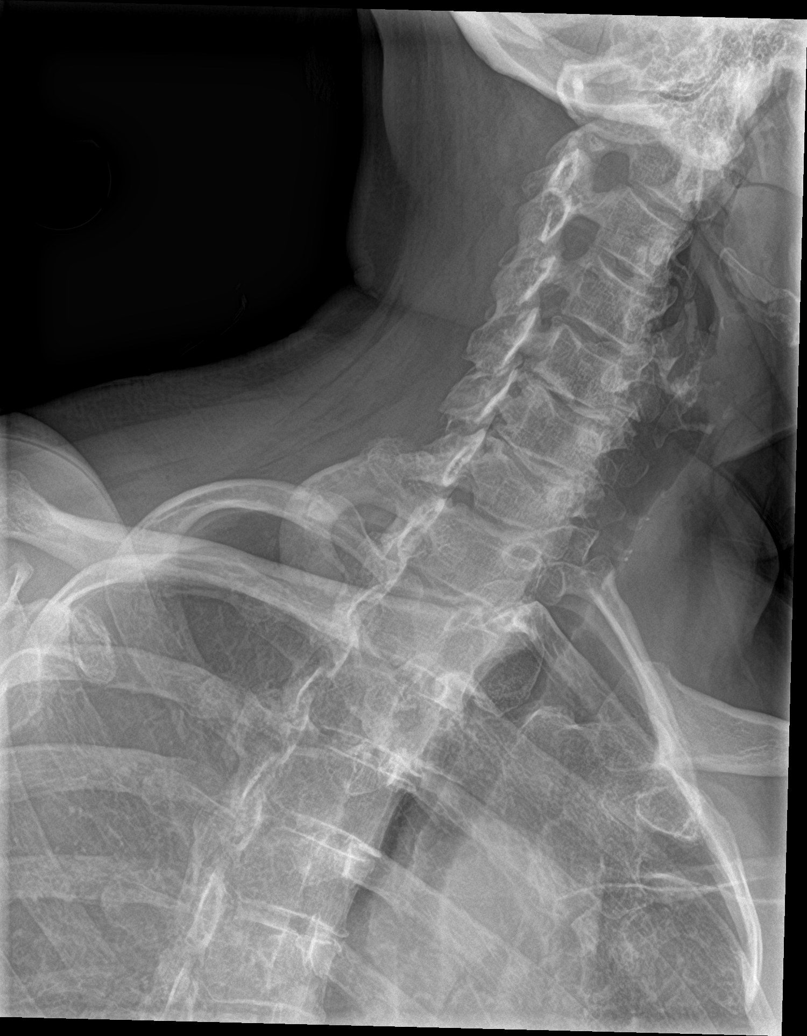

[c-spine ap]
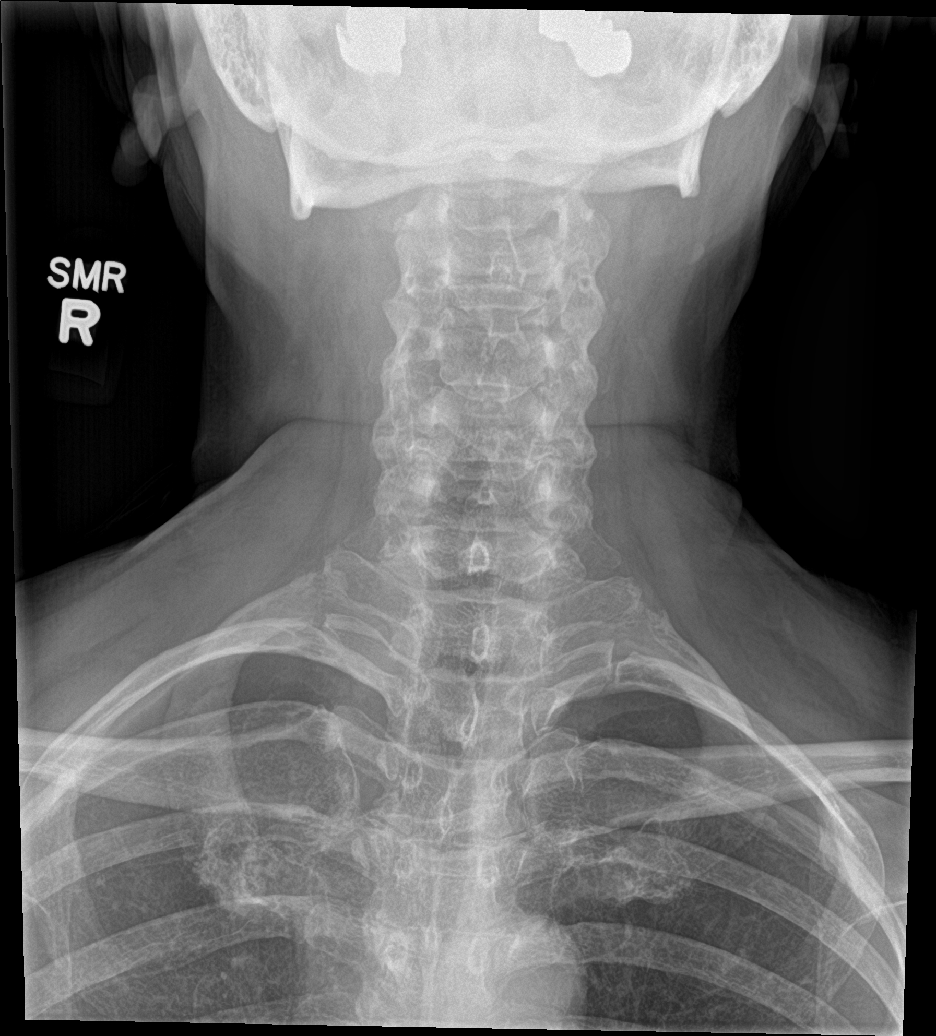

[c-spine open mouth (1 of 2)]
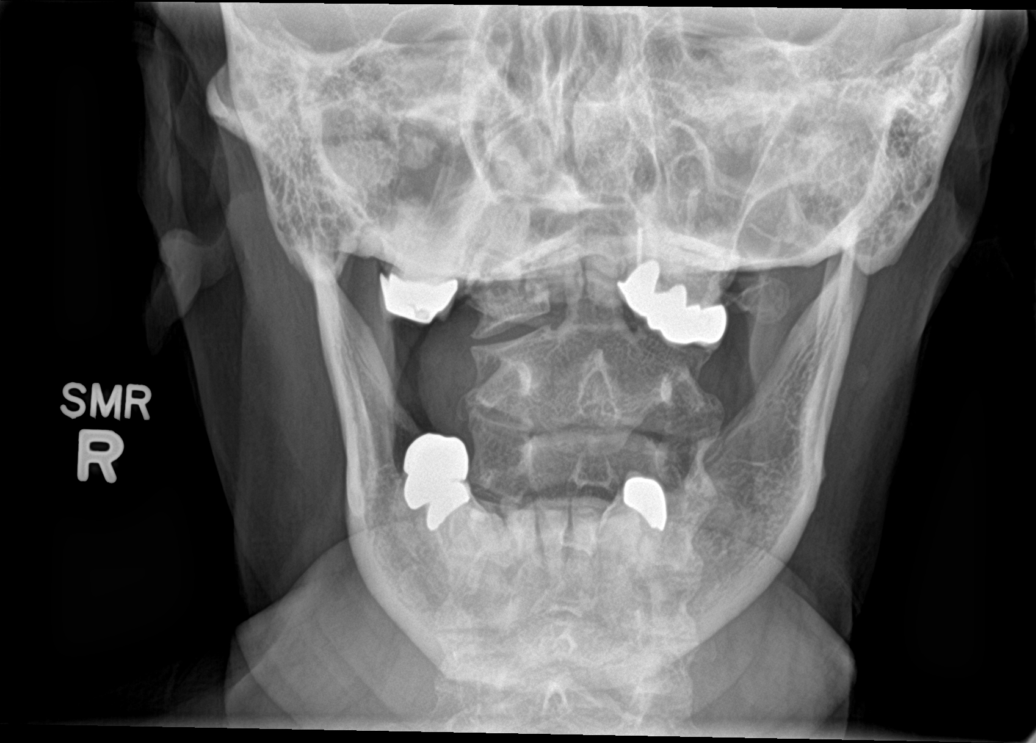

[c-spine open mouth (2 of 2)]
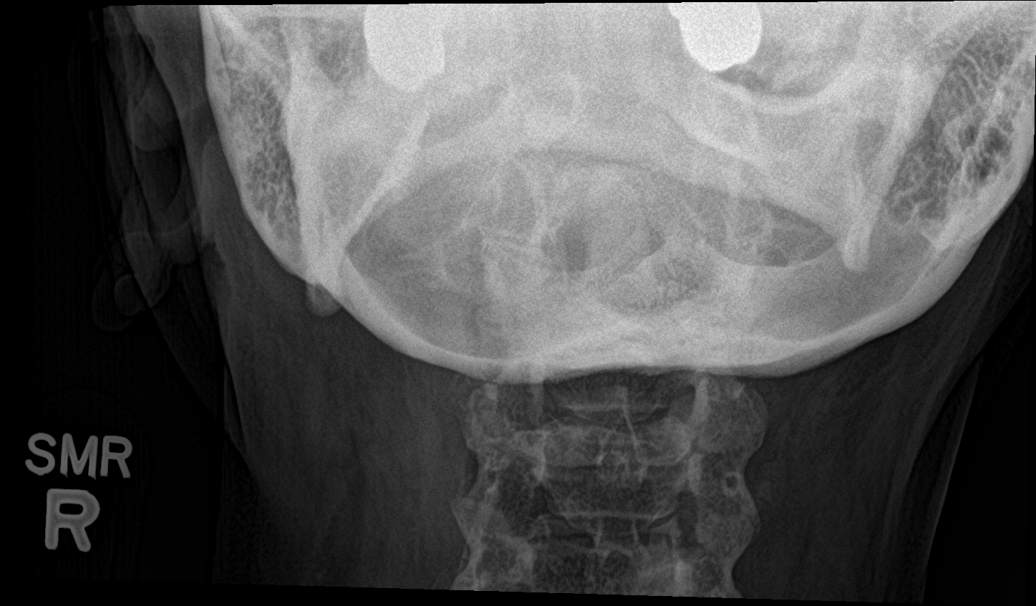

[6 of 6 positions shown; findings below may reference images not displayed]

FINDINGS: Cervical alignment within normal limits. Vertebral body heights are
maintained. Mild degenerative changes at C4-C5 and C5-C6 with
moderate degenerative change at C6-C7. Bilateral foraminal narrowing
at C5-C6 and C6-C7. Right lateral mass within normal limits. Dens
and left lateral mass partially obscured by overlying teeth
IMPRESSION: Degenerative changes, most marked at C6-C7.

## 2022-05-16 ENCOUNTER — Other Ambulatory Visit: Payer: Self-pay | Admitting: Urology

## 2022-05-16 DIAGNOSIS — N200 Calculus of kidney: Secondary | ICD-10-CM

## 2022-06-09 ENCOUNTER — Other Ambulatory Visit: Payer: Self-pay | Admitting: Cardiology

## 2022-06-12 ENCOUNTER — Other Ambulatory Visit: Payer: Self-pay

## 2022-06-12 DIAGNOSIS — N200 Calculus of kidney: Secondary | ICD-10-CM

## 2022-06-15 ENCOUNTER — Ambulatory Visit (HOSPITAL_COMMUNITY)
Admission: RE | Admit: 2022-06-15 | Discharge: 2022-06-15 | Disposition: A | Discharge status: Home / Self Care | Payer: Medicare HMO | Source: Ambulatory Visit | Attending: Urology | Admitting: Urology

## 2022-06-15 DIAGNOSIS — N200 Calculus of kidney: Secondary | ICD-10-CM | POA: Diagnosis not present

## 2022-06-15 DIAGNOSIS — I878 Other specified disorders of veins: Secondary | ICD-10-CM | POA: Diagnosis not present

## 2022-06-16 ENCOUNTER — Encounter: Payer: Self-pay | Admitting: Urology

## 2022-06-16 ENCOUNTER — Ambulatory Visit (INDEPENDENT_AMBULATORY_CARE_PROVIDER_SITE_OTHER): Payer: Medicare HMO | Admitting: Urology

## 2022-06-16 VITALS — BP 100/61 | HR 72

## 2022-06-16 DIAGNOSIS — N200 Calculus of kidney: Secondary | ICD-10-CM

## 2022-06-16 DIAGNOSIS — A5901 Trichomonal vulvovaginitis: Secondary | ICD-10-CM
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
## 2022-06-17 LAB — URINALYSIS, ROUTINE W REFLEX MICROSCOPIC
Bilirubin, UA: NEGATIVE
Glucose, UA: NEGATIVE
Ketones, UA: NEGATIVE
Nitrite, UA: NEGATIVE
Specific Gravity, UA: 1.02 (ref 1.005–1.030)
Urobilinogen, Ur: 1 mg/dL (ref 0.2–1.0)
pH, UA: 7 (ref 5.0–7.5)

## 2022-06-17 LAB — MICROSCOPIC EXAMINATION
RBC, Urine: >30 /hpf — AB (ref 0–2)
WBC, UA: >30 /hpf — AB (ref 0–5)

## 2022-06-18 ENCOUNTER — Telehealth: Payer: Self-pay

## 2022-06-18 ENCOUNTER — Ambulatory Visit (HOSPITAL_COMMUNITY)
Admission: RE | Admit: 2022-06-18 | Discharge: 2022-06-18 | Disposition: A | Discharge status: Home / Self Care | Payer: Medicare HMO | Source: Ambulatory Visit | Attending: Urology | Admitting: Urology

## 2022-06-18 DIAGNOSIS — N200 Calculus of kidney: Secondary | ICD-10-CM | POA: Diagnosis not present

## 2022-06-18 DIAGNOSIS — N281 Cyst of kidney, acquired: Secondary | ICD-10-CM | POA: Diagnosis not present

## 2022-06-18 LAB — URINE CULTURE

## 2022-06-18 NOTE — Telephone Encounter (Signed)
Tried calling patient with no answer, left vm for return call

## 2022-06-18 NOTE — Telephone Encounter (Signed)
Received message from on - call base. Patient states an antibiotic was suppose to be sent to pharmacy. Per ov note you wanted to send cephalexin. Please advise on dose and duration of antibiotic.

## 2022-06-19 NOTE — Telephone Encounter (Signed)
Left detailed vm making patient aware that her culture was negative and no antibx needed.

## 2022-06-19 NOTE — Telephone Encounter (Signed)
-----   Message from Franchot Gallo, MD sent at 06/18/2022  2:48 PM EST ----- You can let patient know that her culture was negative and she does not need to continue antibiotic. ----- Message ----- From: Sherrilyn Rist, CMA Sent: 06/18/2022   8:47 AM EST To: Franchot Gallo, MD  Please review

## 2022-06-22 ENCOUNTER — Telehealth: Payer: Self-pay

## 2022-06-22 NOTE — Telephone Encounter (Signed)
-----   Message from Franchot Gallo, MD sent at 06/19/2022  1:43 PM EST ----- Please call pt--CT showed stones in kidneys as well as cysts (common in adults). Pain is not from stones. OK to f/u w/ me to recheck in 2-3 mos ----- Message ----- From: Sherrilyn Rist, CMA Sent: 06/19/2022   8:01 AM EST To: Franchot Gallo, MD  Please review

## 2022-06-22 NOTE — Telephone Encounter (Signed)
Tried calling patient. Left Vm for return call

## 2022-06-22 NOTE — Telephone Encounter (Signed)
Left VM for return call

## 2022-06-26 NOTE — Telephone Encounter (Signed)
Left detailed vm making patient aware and return call to scheduled F/U OV.

## 2022-06-26 NOTE — Telephone Encounter (Signed)
Letter sent out 

## 2022-07-02 ENCOUNTER — Ambulatory Visit (INDEPENDENT_AMBULATORY_CARE_PROVIDER_SITE_OTHER): Payer: Medicare HMO | Admitting: Family Medicine

## 2022-07-02 ENCOUNTER — Encounter: Payer: Self-pay | Admitting: Family Medicine

## 2022-07-02 VITALS — BP 100/81 | HR 84 | Ht 68.0 in | Wt 192.0 lb

## 2022-07-02 DIAGNOSIS — E785 Hyperlipidemia, unspecified: Secondary | ICD-10-CM

## 2022-07-02 DIAGNOSIS — Z0001 Encounter for general adult medical examination with abnormal findings: Secondary | ICD-10-CM | POA: Insufficient documentation

## 2022-07-02 DIAGNOSIS — G894 Chronic pain syndrome: Secondary | ICD-10-CM

## 2022-07-02 DIAGNOSIS — I25118 Atherosclerotic heart disease of native coronary artery with other forms of angina pectoris: Secondary | ICD-10-CM

## 2022-07-02 DIAGNOSIS — I10 Essential (primary) hypertension: Secondary | ICD-10-CM

## 2022-07-02 DIAGNOSIS — E039 Hypothyroidism, unspecified: Secondary | ICD-10-CM | POA: Diagnosis not present

## 2022-07-02 DIAGNOSIS — R7301 Impaired fasting glucose: Secondary | ICD-10-CM

## 2022-07-02 DIAGNOSIS — Z13820 Encounter for screening for osteoporosis: Secondary | ICD-10-CM

## 2022-07-02 DIAGNOSIS — Z1159 Encounter for screening for other viral diseases: Secondary | ICD-10-CM

## 2022-07-02 DIAGNOSIS — F339 Major depressive disorder, recurrent, unspecified: Secondary | ICD-10-CM | POA: Diagnosis not present

## 2022-07-02 DIAGNOSIS — E1159 Type 2 diabetes mellitus with other circulatory complications: Secondary | ICD-10-CM | POA: Diagnosis not present

## 2022-07-02 MED ORDER — LORAZEPAM 1 MG PO TABS: 1.0000 mg | ORAL_TABLET | Freq: Every day | ORAL | 0 refills | Status: AC

## 2022-07-02 MED ORDER — HYDROCHLOROTHIAZIDE 25 MG PO TABS: 25.0000 mg | ORAL_TABLET | Freq: Two times a day (BID) | ORAL | 2 refills | Status: AC

## 2022-07-02 NOTE — Assessment & Plan Note (Addendum)
Physical exam done, labs ordered  Updated screening and health maintenance  Exercise and nutrition counseling BMI  29.19 Discussed to follow a DASH diet which includes vegetables,fruits,whole grains, fat free or low fat diary,fish,poultry,beans,nuts and seeds,vegetable oils. Find an activity that you will enjoy and start to be active at least 5 days a week for 30 minutes each day.   [place] Visit from 07/02/2022 in [facility]  PHQ-9 Total Score 15     On Zoloft 100 mg daily and Ativan 1 mg for insomnia  Discussed non pharmacological interventions such seeing a therapist, support system, diet, exercise, sleep. Follow up in 3 months or sooner if needed. Patient verbalizes understanding regarding plan of care and all questions answered.  Referral placed to behavioral health

## 2022-07-02 NOTE — Progress Notes (Signed)
Complete physical exam  Patient: Sandy Meyer   DOB: 1946-04-12   77 y.o. Female  MRN: [id number]  Subjective:    Chief Complaint  Patient presents with   Gilmer is a 77 y.o. female who presents today for a complete physical exam. She reports consuming a general diet. Home exercise routine includes leg exercises . She generally feels fairly well. She reports sleeping fairly well. She does not have additional problems to discuss today.    Most recent fall risk assessment:    07/02/2022    4:11 PM  [place] in the past year? 1  Number falls in past yr: 0  Injury with Fall? 0     Most recent depression screenings:    07/02/2022    4:11 PM 05/09/2020    3:50 PM  PHQ 2/9 Scores  PHQ - 2 Score 3 6  PHQ- 9 Score 15 8    Vision:Not within last year   Patient Care Team: Del Eli Hose, FNP as PCP - General (Family Medicine) Harl Bowie Alphonse Guild, MD as PCP - Cardiology (Cardiology)   Outpatient Medications Prior to Visit  Medication Sig   amitriptyline (ELAVIL) 10 MG tablet Take 20 mg by mouth at bedtime.    aspirin 81 MG chewable tablet Chew 81 mg by mouth every evening.    atorvastatin (LIPITOR) 80 MG tablet TAKE 1 TABLET BY MOUTH EVERY DAY   estradiol (VIVELLE-DOT) 0.05 MG/24HR patch Place 1 patch onto the skin every Sunday.    ezetimibe (ZETIA) 10 MG tablet Take 1 tablet (10 mg total) by mouth daily.   famotidine (PEPCID) 20 MG tablet Take 1 tablet (20 mg total) by mouth 2 (two) times daily.   gabapentin (NEURONTIN) 300 MG capsule Take 300 mg by mouth 2 (two) times daily.   glucose blood (TRUE METRIX BLOOD GLUCOSE TEST) test strip Use as instructed to check blood glucose once a day.   HYDROmorphone (DILAUDID) 2 MG tablet Take by mouth every 4 (four) hours as needed for severe pain.   Lancets MISC 1 each by Does not apply route daily.   latanoprost (XALATAN) 0.005 % ophthalmic solution Place 1 drop into both eyes at bedtime.    lisinopril (ZESTRIL) 5 MG tablet TAKE 1 TABLET (5 MG TOTAL) BY MOUTH DAILY.   nitroGLYCERIN (NITROSTAT) 0.4 MG SL tablet ONE TABLET UNDER TONGUE AS NEEDED FOR CHEST PAIN   Omega-3 Fatty Acids (FISH OIL) 1000 MG CAPS Take 1 capsule by mouth at bedtime.    pioglitazone (ACTOS) 15 MG tablet Take 15 mg by mouth daily.   potassium citrate (UROCIT-K) 10 MEQ (1080 MG) SR tablet TAKE 1 TABLET (10 MEQ TOTAL) BY MOUTH 4 (FOUR) TIMES DAILY.   sertraline (ZOLOFT) 100 MG tablet Take 100 mg by mouth daily.   timolol (BETIMOL) 0.5 % ophthalmic solution Place 1 drop into both eyes daily.   [DISCONTINUED] hydrochlorothiazide (HYDRODIURIL) 25 MG tablet TAKE 1 TABLET BY MOUTH TWICE A DAY   [DISCONTINUED] LORazepam (ATIVAN) 1 MG tablet Take 2 mg by mouth at bedtime.    fluconazole (DIFLUCAN) 150 MG tablet Take 150 mg by mouth daily as needed.   metroNIDAZOLE (FLAGYL) 500 MG tablet Take 4 tablets at once   No facility-administered medications prior to visit.    Review of Systems  Constitutional:  Negative for chills and fever.  HENT:  Negative for sinus pain.   Respiratory:  Negative for shortness of breath.  Cardiovascular:  Negative for chest pain.  Gastrointestinal:  Negative for abdominal pain, nausea and vomiting.  Genitourinary:  Negative for dysuria and flank pain.  Musculoskeletal:  Positive for back pain.  Skin:  Negative for itching and rash.  Neurological:  Negative for dizziness.       Objective:    BP 100/81   Pulse 84   Ht '5\' 8"'$  (1.727 m)   Wt 192 lb (87.1 kg)   SpO2 92%   BMI 29.19 kg/m  BP Readings from Last 3 Encounters:  07/02/22 100/81  06/16/22 100/61  02/17/22 120/77      Physical Exam Constitutional:      Appearance: Normal appearance.  HENT:     Head: Normocephalic.     Right Ear: Tympanic membrane normal.     Left Ear: Tympanic membrane normal.     Nose: Nose normal.     Mouth/Throat:     Mouth: Mucous membranes are moist.  Eyes:     Extraocular Movements:  Extraocular movements intact.     Pupils: Pupils are equal, round, and reactive to light.  Cardiovascular:     Rate and Rhythm: Normal rate.     Pulses: Normal pulses.  Pulmonary:     Effort: Pulmonary effort is normal.     Breath sounds: Normal breath sounds.  Abdominal:     General: Bowel sounds are normal. There is no distension.     Palpations: Abdomen is soft. There is no mass.     Tenderness: There is no right CVA tenderness or left CVA tenderness.  Musculoskeletal:        General: No swelling or tenderness.     Cervical back: Neck supple. Decreased range of motion.     Lumbar back: Decreased range of motion. Positive right straight leg raise test.  Skin:    General: Skin is warm.     Capillary Refill: Capillary refill takes less than 2 seconds.  Neurological:     General: No focal deficit present.     Mental Status: She is alert.     Gait: Gait normal.     Deep Tendon Reflexes: Reflexes normal.  Psychiatric:        Mood and Affect: Mood normal.      No results found for any visits on 07/02/22.    Assessment & Plan:    Routine Health Maintenance and Physical Exam  Immunization History  Administered Date(s) Administered   Fluad Quad(high Dose 65+) 02/06/2021   Influenza Inj Mdck Quad Pf 03/04/2022   Influenza, High Dose Seasonal PF 01/27/2017, 01/26/2018   Moderna Sars-Covid-2 Vaccination 06/12/2019, 07/06/2019, 03/25/2020   PFIZER(Purple Top)SARS-COV-2 Vaccination 02/06/2021   Pneumococcal Conjugate-13 08/18/2013   Pneumococcal Polysaccharide-23 09/06/2014   Tdap 09/29/2016   Zoster, Live 08/13/2014    Health Maintenance  Topic Date Due   OPHTHALMOLOGY EXAM  Never done   Diabetic kidney evaluation - Urine ACR  Never done   Hepatitis C Screening  Never done   COLONOSCOPY (Pts 45-25yr Insurance coverage will need to be confirmed)  Never done   Zoster Vaccines- Shingrix (1 of 2) Never done   HEMOGLOBIN A1C  11/04/2020   Diabetic kidney evaluation - eGFR  measurement  03/12/2021   Medicare Annual Wellness (AWV)  06/24/2022   COVID-19 Vaccine (5 - 2023-24 season) 07/18/2022 (Originally 12/26/2021)   FOOT EXAM  07/02/2023   MAMMOGRAM  01/29/2024   DTaP/Tdap/Td (2 - Td or Tdap) 09/30/2026   Pneumonia Vaccine 75 Years old  Completed  INFLUENZA VACCINE  Completed   DEXA SCAN  Completed   HPV VACCINES  Aged Out    Discussed health benefits of physical activity, and encouraged her to engage in regular exercise appropriate for her age and condition.  Primary hypertension -     CBC with Differential/Platelet -     CMP14+EGFR -     Microalbumin / creatinine urine ratio  IFG (impaired fasting glucose) -     Hemoglobin A1c  Hyperlipidemia, unspecified hyperlipidemia type -     Lipid panel  Hypothyroidism, unspecified type -     TSH + free T4  Need for hepatitis C screening test -     Hepatitis C antibody  Screening for osteoporosis -     HM DEXA SCAN  Chronic pain disorder -     Ambulatory referral to Pain Clinic  Encounter for routine adult physical exam with abnormal findings Assessment & Plan: Physical exam done, labs ordered  Updated screening and health maintenance  Exercise and nutrition counseling BMI  29.19 Discussed to follow a DASH diet which includes vegetables,fruits,whole grains, fat free or low fat diary,fish,poultry,beans,nuts and seeds,vegetable oils. Find an activity that you will enjoy and start to be active at least 5 days a week for 30 minutes each day.   [place] Visit from 07/02/2022 in [facility]  PHQ-9 Total Score 15     On Zoloft 100 mg daily and Ativan 1 mg for insomnia  Discussed non pharmacological interventions such seeing a therapist, support system, diet, exercise, sleep. Follow up in 3 months or sooner if needed. Patient verbalizes understanding regarding plan of care and all questions answered.  Referral placed to behavioral health    Depression, recurrent  ([place]) -     Amb ref to [facility]  Other orders -     hydroCHLOROthiazide; Take 1 tablet (25 mg total) by mouth 2 (two) times daily.  Dispense: 180 tablet; Refill: 2 -     LORazepam; Take 1 tablet (1 mg total) by mouth at bedtime.  Dispense: 30 tablet; Refill: 0    Return in about 3 months (around 10/02/2022) for chronic follow-up.     Renard Hamper Ria Comment, FNP

## 2022-07-02 NOTE — Patient Instructions (Signed)
It was pleasure meeting with you today. Follow up with your primary health provider if any health concerns arises.

## 2022-07-09 ENCOUNTER — Telehealth: Payer: Self-pay | Admitting: Family Medicine

## 2022-07-09 NOTE — Telephone Encounter (Addendum)
Pt called in regard to amitriptyline (ELAVIL) 10 MG tablet   States that provider was going to look in to med before sending it in. Patient has not heard anything back in regard.   Patient states that she needs med for sleep and has not been sleeping well since she has run out   Wants a call back.

## 2022-07-10 ENCOUNTER — Other Ambulatory Visit: Payer: Self-pay | Admitting: Family Medicine

## 2022-07-10 NOTE — Telephone Encounter (Signed)
Please inform patient it is not safe to take Zoloft and amitriptyline and she is at for risk of developing serotonin syndrome especially not advise for her age. I have prescribed Ativan for insomnia in our last visit.

## 2022-07-13 DIAGNOSIS — K219 Gastro-esophageal reflux disease without esophagitis: Secondary | ICD-10-CM | POA: Diagnosis not present

## 2022-07-13 DIAGNOSIS — M1731 Unilateral post-traumatic osteoarthritis, right knee: Secondary | ICD-10-CM | POA: Diagnosis not present

## 2022-07-13 DIAGNOSIS — I10 Essential (primary) hypertension: Secondary | ICD-10-CM | POA: Diagnosis not present

## 2022-07-13 DIAGNOSIS — M4805 Spinal stenosis, thoracolumbar region: Secondary | ICD-10-CM | POA: Diagnosis not present

## 2022-07-13 DIAGNOSIS — E1143 Type 2 diabetes mellitus with diabetic autonomic (poly)neuropathy: Secondary | ICD-10-CM | POA: Diagnosis not present

## 2022-07-13 DIAGNOSIS — Z6829 Body mass index (BMI) 29.0-29.9, adult: Secondary | ICD-10-CM | POA: Diagnosis not present

## 2022-07-16 DIAGNOSIS — F17200 Nicotine dependence, unspecified, uncomplicated: Secondary | ICD-10-CM | POA: Diagnosis not present

## 2022-07-16 DIAGNOSIS — E669 Obesity, unspecified: Secondary | ICD-10-CM | POA: Diagnosis not present

## 2022-07-16 DIAGNOSIS — R2689 Other abnormalities of gait and mobility: Secondary | ICD-10-CM | POA: Diagnosis not present

## 2022-07-16 DIAGNOSIS — G8929 Other chronic pain: Secondary | ICD-10-CM | POA: Diagnosis not present

## 2022-07-17 DIAGNOSIS — M4805 Spinal stenosis, thoracolumbar region: Secondary | ICD-10-CM | POA: Diagnosis not present

## 2022-07-17 DIAGNOSIS — I10 Essential (primary) hypertension: Secondary | ICD-10-CM | POA: Diagnosis not present

## 2022-07-17 DIAGNOSIS — E1143 Type 2 diabetes mellitus with diabetic autonomic (poly)neuropathy: Secondary | ICD-10-CM | POA: Diagnosis not present

## 2022-07-17 DIAGNOSIS — M1731 Unilateral post-traumatic osteoarthritis, right knee: Secondary | ICD-10-CM | POA: Diagnosis not present

## 2022-07-17 DIAGNOSIS — K219 Gastro-esophageal reflux disease without esophagitis: Secondary | ICD-10-CM | POA: Diagnosis not present

## 2022-07-27 DIAGNOSIS — H2512 Age-related nuclear cataract, left eye: Secondary | ICD-10-CM | POA: Diagnosis not present

## 2022-07-27 DIAGNOSIS — H35373 Puckering of macula, bilateral: Secondary | ICD-10-CM | POA: Diagnosis not present

## 2022-07-27 DIAGNOSIS — H401131 Primary open-angle glaucoma, bilateral, mild stage: Secondary | ICD-10-CM | POA: Diagnosis not present

## 2022-07-30 ENCOUNTER — Other Ambulatory Visit: Payer: Self-pay | Admitting: Family Medicine
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
## 2022-08-25 ENCOUNTER — Encounter: Payer: Self-pay | Admitting: Urology

## 2022-08-25 ENCOUNTER — Ambulatory Visit (INDEPENDENT_AMBULATORY_CARE_PROVIDER_SITE_OTHER): Payer: Medicare HMO | Admitting: Urology

## 2022-08-25 VITALS — BP 110/73 | HR 81 | Wt 192.2 lb

## 2022-08-25 DIAGNOSIS — N200 Calculus of kidney: Secondary | ICD-10-CM | POA: Diagnosis not present

## 2022-08-25 DIAGNOSIS — N281 Cyst of kidney, acquired: Secondary | ICD-10-CM

## 2022-08-26 ENCOUNTER — Encounter (HOSPITAL_COMMUNITY): Payer: Medicare HMO

## 2022-08-26 LAB — URINALYSIS, ROUTINE W REFLEX MICROSCOPIC
Bilirubin, UA: NEGATIVE
Glucose, UA: NEGATIVE
Ketones, UA: NEGATIVE
Nitrite, UA: NEGATIVE
Specific Gravity, UA: 1.02 (ref 1.005–1.030)
Urobilinogen, Ur: 0.2 mg/dL (ref 0.2–1.0)
pH, UA: 7 (ref 5.0–7.5)

## 2022-08-26 LAB — MICROSCOPIC EXAMINATION: Epithelial Cells (non renal): >10 /hpf — AB (ref 0–10)

## 2022-08-28 ENCOUNTER — Ambulatory Visit: Admit: 2022-08-28 | Payer: Medicare HMO | Admitting: Ophthalmology

## 2022-08-28 SURGERY — PHACOEMULSIFICATION, CATARACT, WITH IOL INSERTION
Anesthesia: Monitor Anesthesia Care | Laterality: Left

## 2022-09-01 DIAGNOSIS — N1832 Chronic kidney disease, stage 3b: Secondary | ICD-10-CM | POA: Diagnosis not present

## 2022-09-01 DIAGNOSIS — E1143 Type 2 diabetes mellitus with diabetic autonomic (poly)neuropathy: Secondary | ICD-10-CM | POA: Diagnosis not present

## 2022-09-01 DIAGNOSIS — M4805 Spinal stenosis, thoracolumbar region: Secondary | ICD-10-CM | POA: Diagnosis not present

## 2022-09-01 DIAGNOSIS — Z6829 Body mass index (BMI) 29.0-29.9, adult: Secondary | ICD-10-CM | POA: Diagnosis not present

## 2022-09-01 DIAGNOSIS — Z0000 Encounter for general adult medical examination without abnormal findings: Secondary | ICD-10-CM | POA: Diagnosis not present

## 2022-09-01 DIAGNOSIS — I10 Essential (primary) hypertension: Secondary | ICD-10-CM | POA: Diagnosis not present

## 2022-09-01 DIAGNOSIS — M1731 Unilateral post-traumatic osteoarthritis, right knee: Secondary | ICD-10-CM | POA: Diagnosis not present

## 2022-09-01 DIAGNOSIS — K219 Gastro-esophageal reflux disease without esophagitis: Secondary | ICD-10-CM | POA: Diagnosis not present

## 2022-09-01 DIAGNOSIS — I2510 Atherosclerotic heart disease of native coronary artery without angina pectoris: Secondary | ICD-10-CM | POA: Diagnosis not present

## 2022-09-18 ENCOUNTER — Ambulatory Visit: Payer: Medicare HMO | Attending: Cardiology | Admitting: Cardiology

## 2022-09-18 ENCOUNTER — Encounter: Payer: Self-pay | Admitting: Cardiology

## 2022-09-18 VITALS — BP 116/73 | HR 70 | Ht 68.0 in | Wt 195.6 lb

## 2022-09-18 DIAGNOSIS — I10 Essential (primary) hypertension: Secondary | ICD-10-CM

## 2022-09-18 DIAGNOSIS — I2510 Atherosclerotic heart disease of native coronary artery without angina pectoris: Secondary | ICD-10-CM | POA: Diagnosis not present

## 2022-09-18 DIAGNOSIS — E782 Mixed hyperlipidemia: Secondary | ICD-10-CM

## 2022-09-18 MED ORDER — NITROGLYCERIN 0.4 MG SL SUBL: SUBLINGUAL_TABLET | SUBLINGUAL | 3 refills | Status: AC

## 2022-09-18 MED ORDER — LISINOPRIL 2.5 MG PO TABS: 2.5000 mg | ORAL_TABLET | Freq: Every day | ORAL | 3 refills | Status: AC

## 2022-09-18 NOTE — Progress Notes (Signed)
Clinical Summary Sandy Meyer is a 77 y.o.female  CAD - history of stent to LAD in 2001, this was not in the setting of ACS - denies chest pains, no SOB/DOE - compliant with meds   2. HTN - home bp's 100s/50s. Often will wait a few hours in AM between taking her HCTZ and lisinopril to avoid lower bp's - she reports on higher dose HCTZ for kidney stones.    3. Hyperlipidemia -05/2021 TC 112 TG 141 HDL 42 LDL 46 06/2022 TC 113 TG 175 HDL 46 LDL 38 - she is on atorvastatin 80, zetia 10mg  daily.   4. DM2 - per pcp   Past Medical History:  Diagnosis Date   Anxiety    Arthritis    Blockage of coronary artery of heart (HCC)    CAD (coronary artery disease)    Cervical cancer (HCC)    Coronary artery disease    Depression    Diabetes (HCC)    Dyslipidemia    Hyperlipidemia    Hypertension    Joint pain    Kidney stones    Medullary sponge kidney of both kidneys    MI (myocardial infarction) (HCC)    Syncope      Allergies  Allergen Reactions   Azithromycin Other (See Comments)    unknown   Buprenorphine Hives   Lactose Intolerance (Gi)    Sulfa Antibiotics Hives and Swelling   Bactrim [Sulfamethoxazole-Trimethoprim] Hives, Swelling and Rash   Celebrex [Celecoxib] Rash     Current Outpatient Medications  Medication Sig Dispense Refill   amitriptyline (ELAVIL) 10 MG tablet Take 20 mg by mouth at bedtime.      aspirin 81 MG chewable tablet Chew 81 mg by mouth every evening.      atorvastatin (LIPITOR) 80 MG tablet TAKE 1 TABLET BY MOUTH EVERY DAY 90 tablet 1   estradiol (VIVELLE-DOT) 0.05 MG/24HR patch Place 1 patch onto the skin every Sunday.      ezetimibe (ZETIA) 10 MG tablet Take 1 tablet (10 mg total) by mouth daily. 90 tablet 0   famotidine (PEPCID) 20 MG tablet Take 1 tablet (20 mg total) by mouth 2 (two) times daily. 30 tablet 0   fluconazole (DIFLUCAN) 150 MG tablet Take 150 mg by mouth daily as needed.     gabapentin (NEURONTIN) 300 MG capsule Take  300 mg by mouth 2 (two) times daily.  2   glucose blood (TRUE METRIX BLOOD GLUCOSE TEST) test strip Use as instructed to check blood glucose once a day. 100 strip 12   hydrochlorothiazide (HYDRODIURIL) 25 MG tablet Take 1 tablet (25 mg total) by mouth 2 (two) times daily. 180 tablet 2   HYDROmorphone (DILAUDID) 2 MG tablet Take by mouth every 4 (four) hours as needed for severe pain.     Lancets MISC 1 each by Does not apply route daily. 100 each 3   latanoprost (XALATAN) 0.005 % ophthalmic solution Place 1 drop into both eyes at bedtime.     lisinopril (ZESTRIL) 5 MG tablet TAKE 1 TABLET (5 MG TOTAL) BY MOUTH DAILY. 30 tablet 0   LORazepam (ATIVAN) 1 MG tablet Take 1 tablet (1 mg total) by mouth at bedtime. 30 tablet 0   metroNIDAZOLE (FLAGYL) 500 MG tablet Take 4 tablets at once 4 tablet 0   nitroGLYCERIN (NITROSTAT) 0.4 MG SL tablet ONE TABLET UNDER TONGUE AS NEEDED FOR CHEST PAIN 25 tablet 3   Omega-3 Fatty Acids (FISH OIL) 1000 MG  CAPS Take 1 capsule by mouth at bedtime.      pioglitazone (ACTOS) 15 MG tablet Take 15 mg by mouth daily.     potassium citrate (UROCIT-K) 10 MEQ (1080 MG) SR tablet TAKE 1 TABLET (10 MEQ TOTAL) BY MOUTH 4 (FOUR) TIMES DAILY. 180 tablet 3   sertraline (ZOLOFT) 100 MG tablet Take 100 mg by mouth daily.     timolol (BETIMOL) 0.5 % ophthalmic solution Place 1 drop into both eyes daily.     No current facility-administered medications for this visit.     Past Surgical History:  Procedure Laterality Date   CESAREAN SECTION     complete hysterectomy     REPLACEMENT TOTAL KNEE       Allergies  Allergen Reactions   Azithromycin Other (See Comments)    unknown   Buprenorphine Hives   Lactose Intolerance (Gi)    Sulfa Antibiotics Hives and Swelling   Bactrim [Sulfamethoxazole-Trimethoprim] Hives, Swelling and Rash   Celebrex [Celecoxib] Rash      Family History  Problem Relation Age of Onset   Heart disease Mother    Heart attack Mother     Hypertension Mother    High Cholesterol Mother    Sudden death Mother    Heart disease Father    Diabetes Father    Heart attack Father    High Cholesterol Father    Hypertension Father    Sudden death Father    Fainting Sister    Hypertension Sister    Heart attack Sister    High Cholesterol Sister    Heart disease Brother    Diabetes Brother    Heart attack Brother    High Cholesterol Brother    Stroke Brother      Social History Sandy Meyer reports that she has been smoking cigarettes. She started smoking about 61 years ago. She has been smoking an average of .33 packs per day. She has never used smokeless tobacco. Ms. Neurohr reports that she does not currently use alcohol.   Review of Systems CONSTITUTIONAL: No weight loss, fever, chills, weakness or fatigue.  HEENT: Eyes: No visual loss, blurred vision, double vision or yellow sclerae.No hearing loss, sneezing, congestion, runny nose or sore throat.  SKIN: No rash or itching.  CARDIOVASCULAR: per hpi RESPIRATORY: No shortness of breath, cough or sputum.  GASTROINTESTINAL: No anorexia, nausea, vomiting or diarrhea. No abdominal pain or blood.  GENITOURINARY: No burning on urination, no polyuria NEUROLOGICAL: No headache, dizziness, syncope, paralysis, ataxia, numbness or tingling in the extremities. No change in bowel or bladder control.  MUSCULOSKELETAL: No muscle, back pain, joint pain or stiffness.  LYMPHATICS: No enlarged nodes. No history of splenectomy.  PSYCHIATRIC: No history of depression or anxiety.  ENDOCRINOLOGIC: No reports of sweating, cold or heat intolerance. No polyuria or polydipsia.  Sandy Meyer   Physical Examination Today's Vitals   09/18/22 1054  BP: 116/73  Pulse: 70  SpO2: 97%  Weight: 195 lb 9.6 oz (88.7 kg)  Height: 5\' 8"  (1.727 m)   Body mass index is 29.74 kg/m.  Gen: resting comfortably, no acute distress HEENT: no scleral icterus, pupils equal round and reactive, no palptable cervical  adenopathy,  CV: RRR, no mrg, no jvd Resp: Clear to auscultation bilaterally GI: abdomen is soft, non-tender, non-distended, normal bowel sounds, no hepatosplenomegaly MSK: extremities are warm, no edema.  Skin: warm, no rash Neuro:  no focal deficits Psych: appropriate affect   Diagnostic Studies Nuclear stress test on 11/10/16 was normal.  10/2016 Echocardiogram demonstrated normal left ventricular systolic function and regional wall motion, LVEF 60-65%. There was mild mitral regurgitation and grade indeterminate diastolic dysfunction.    Assessment and Plan  1.CAD - no symptoms - EKG today shows NSR no ischemc changes -continue current meds   2. HTN - low bp's at times, lower lisinopril to 2.5mg  daily - of note on bid HCTZ to help prevent kidney stones, have not lowered dose   3. Hyperlipidemia -LDL at goal discussed diet and exercise to improve TGs and HDL  Continue current meds, f/u 1 year      Antoine Poche, MD

## 2022-09-18 NOTE — Patient Instructions (Signed)
Medication Instructions:   Decrease Lisinopril to 2.5mg  daily  Continue all other medications.     Labwork:  none  Testing/Procedures:  none  Follow-Up:  Your physician wants you to follow up in:  1 year.  You should receive a recall letter in the mail about 2 months prior to the time you are due.  If you don't receive this, please call our office to schedule your follow up appointment.      Any Other Special Instructions Will Be Listed Below (If Applicable).   If you need a refill on your cardiac medications before your next appointment, please call your pharmacy.

## 2022-09-21 ENCOUNTER — Other Ambulatory Visit: Payer: Self-pay | Admitting: Cardiology

## 2022-10-02 ENCOUNTER — Ambulatory Visit: Payer: Medicare HMO | Admitting: Family Medicine

## 2022-10-16 ENCOUNTER — Telehealth: Payer: Self-pay | Admitting: Family Medicine

## 2022-10-16 NOTE — Telephone Encounter (Signed)
[facility] called in on patient behalf. Needs to know exactly where patiens pain is in regard to pain management referral   Call back info  [place] [phone] ext [phone]

## 2022-10-19 NOTE — Telephone Encounter (Signed)
I looked in the ov note and didn't see the location of her chronic pain. The pain clinic is wanting this info before they will schedule pt

## 2022-10-28 ENCOUNTER — Telehealth: Payer: Self-pay | Admitting: Family Medicine

## 2022-10-28 NOTE — Telephone Encounter (Signed)
Nichole called from [facility] [phone] about referral, had a question on referral needs to know exactly where is the patient pain, does not state it in the notes.

## 2022-10-28 NOTE — Telephone Encounter (Signed)
Sandy Meyer ext [phone]

## 2022-10-30 NOTE — Telephone Encounter (Signed)
Returned call, left VM. Patient does not have a recent referral. Last one was ordered 4 months ago.

## 2022-11-04 NOTE — Telephone Encounter (Signed)
Nichole called from [facility] and left a voicemail asking for a return call# [phone] ext [phone].

## 2022-11-04 NOTE — Telephone Encounter (Signed)
I am not sure , ill look into it again

## 2022-11-20 ENCOUNTER — Telehealth: Payer: Self-pay | Admitting: Family Medicine

## 2022-11-20 NOTE — Telephone Encounter (Signed)
Melton Alar w. [facility] called in on patient behalf in regard to referral sent to them.  Has a few question in regard. Wants a cll back   [phone] ext [phone]

## 2022-11-23 NOTE — Telephone Encounter (Signed)
Spoke with [facility].

## 2022-12-08 DIAGNOSIS — Z6829 Body mass index (BMI) 29.0-29.9, adult: Secondary | ICD-10-CM | POA: Diagnosis not present

## 2022-12-08 DIAGNOSIS — I10 Essential (primary) hypertension: Secondary | ICD-10-CM | POA: Diagnosis not present

## 2022-12-08 DIAGNOSIS — E1143 Type 2 diabetes mellitus with diabetic autonomic (poly)neuropathy: Secondary | ICD-10-CM | POA: Diagnosis not present

## 2022-12-08 DIAGNOSIS — M4805 Spinal stenosis, thoracolumbar region: Secondary | ICD-10-CM | POA: Diagnosis not present

## 2022-12-08 DIAGNOSIS — K219 Gastro-esophageal reflux disease without esophagitis: Secondary | ICD-10-CM | POA: Diagnosis not present

## 2022-12-08 DIAGNOSIS — I2510 Atherosclerotic heart disease of native coronary artery without angina pectoris: Secondary | ICD-10-CM | POA: Diagnosis not present

## 2022-12-08 DIAGNOSIS — M1731 Unilateral post-traumatic osteoarthritis, right knee: Secondary | ICD-10-CM | POA: Diagnosis not present

## 2022-12-08 DIAGNOSIS — Z0000 Encounter for general adult medical examination without abnormal findings: Secondary | ICD-10-CM | POA: Diagnosis not present

## 2022-12-08 DIAGNOSIS — N1832 Chronic kidney disease, stage 3b: Secondary | ICD-10-CM | POA: Diagnosis not present

## 2022-12-09 ENCOUNTER — Other Ambulatory Visit: Payer: Self-pay | Admitting: Cardiology

## 2022-12-10 DIAGNOSIS — R030 Elevated blood-pressure reading, without diagnosis of hypertension: Secondary | ICD-10-CM | POA: Diagnosis not present

## 2022-12-10 DIAGNOSIS — T23051A Burn of unspecified degree of right palm, initial encounter: Secondary | ICD-10-CM | POA: Diagnosis not present

## 2022-12-10 DIAGNOSIS — E663 Overweight: Secondary | ICD-10-CM | POA: Diagnosis not present

## 2022-12-10 DIAGNOSIS — Z6829 Body mass index (BMI) 29.0-29.9, adult: Secondary | ICD-10-CM | POA: Diagnosis not present

## 2022-12-29 DIAGNOSIS — Z96651 Presence of right artificial knee joint: Secondary | ICD-10-CM | POA: Diagnosis not present

## 2022-12-29 DIAGNOSIS — F17210 Nicotine dependence, cigarettes, uncomplicated: Secondary | ICD-10-CM | POA: Diagnosis not present

## 2022-12-29 DIAGNOSIS — R079 Chest pain, unspecified: Secondary | ICD-10-CM | POA: Diagnosis not present

## 2022-12-29 DIAGNOSIS — J9601 Acute respiratory failure with hypoxia: Secondary | ICD-10-CM | POA: Diagnosis not present

## 2022-12-29 DIAGNOSIS — Z9981 Dependence on supplemental oxygen: Secondary | ICD-10-CM | POA: Diagnosis not present

## 2022-12-29 DIAGNOSIS — F32A Depression, unspecified: Secondary | ICD-10-CM | POA: Diagnosis not present

## 2022-12-29 DIAGNOSIS — I2510 Atherosclerotic heart disease of native coronary artery without angina pectoris: Secondary | ICD-10-CM | POA: Diagnosis not present

## 2022-12-29 DIAGNOSIS — R0602 Shortness of breath: Secondary | ICD-10-CM | POA: Diagnosis not present

## 2022-12-29 DIAGNOSIS — U071 COVID-19: Secondary | ICD-10-CM | POA: Diagnosis not present

## 2022-12-29 DIAGNOSIS — R531 Weakness: Secondary | ICD-10-CM | POA: Diagnosis not present

## 2022-12-29 DIAGNOSIS — E119 Type 2 diabetes mellitus without complications: Secondary | ICD-10-CM | POA: Diagnosis not present

## 2022-12-29 DIAGNOSIS — Z743 Need for continuous supervision: Secondary | ICD-10-CM | POA: Diagnosis not present

## 2022-12-29 DIAGNOSIS — J9600 Acute respiratory failure, unspecified whether with hypoxia or hypercapnia: Secondary | ICD-10-CM | POA: Diagnosis not present

## 2022-12-29 DIAGNOSIS — M549 Dorsalgia, unspecified: Secondary | ICD-10-CM | POA: Diagnosis not present

## 2022-12-29 DIAGNOSIS — N200 Calculus of kidney: Secondary | ICD-10-CM | POA: Diagnosis not present

## 2022-12-29 DIAGNOSIS — G8929 Other chronic pain: Secondary | ICD-10-CM | POA: Diagnosis not present

## 2022-12-29 DIAGNOSIS — Q615 Medullary cystic kidney: Secondary | ICD-10-CM | POA: Diagnosis not present

## 2022-12-29 DIAGNOSIS — F17200 Nicotine dependence, unspecified, uncomplicated: Secondary | ICD-10-CM | POA: Diagnosis not present

## 2022-12-29 DIAGNOSIS — A419 Sepsis, unspecified organism: Secondary | ICD-10-CM | POA: Diagnosis not present

## 2022-12-29 DIAGNOSIS — I10 Essential (primary) hypertension: Secondary | ICD-10-CM | POA: Diagnosis not present

## 2022-12-29 DIAGNOSIS — N390 Urinary tract infection, site not specified: Secondary | ICD-10-CM | POA: Diagnosis not present

## 2023-01-08 DIAGNOSIS — R110 Nausea: Secondary | ICD-10-CM | POA: Diagnosis not present

## 2023-01-08 DIAGNOSIS — R011 Cardiac murmur, unspecified: Secondary | ICD-10-CM | POA: Diagnosis not present

## 2023-01-08 DIAGNOSIS — H269 Unspecified cataract: Secondary | ICD-10-CM | POA: Diagnosis not present

## 2023-01-08 DIAGNOSIS — E785 Hyperlipidemia, unspecified: Secondary | ICD-10-CM | POA: Diagnosis not present

## 2023-01-08 DIAGNOSIS — E1140 Type 2 diabetes mellitus with diabetic neuropathy, unspecified: Secondary | ICD-10-CM | POA: Diagnosis not present

## 2023-01-08 DIAGNOSIS — I2510 Atherosclerotic heart disease of native coronary artery without angina pectoris: Secondary | ICD-10-CM | POA: Diagnosis not present

## 2023-01-08 DIAGNOSIS — I10 Essential (primary) hypertension: Secondary | ICD-10-CM | POA: Diagnosis not present

## 2023-01-08 DIAGNOSIS — E039 Hypothyroidism, unspecified: Secondary | ICD-10-CM | POA: Diagnosis not present

## 2023-01-08 DIAGNOSIS — F5101 Primary insomnia: Secondary | ICD-10-CM | POA: Diagnosis not present

## 2023-01-08 DIAGNOSIS — E119 Type 2 diabetes mellitus without complications: Secondary | ICD-10-CM | POA: Diagnosis not present

## 2023-01-08 DIAGNOSIS — E663 Overweight: Secondary | ICD-10-CM | POA: Diagnosis not present

## 2023-01-19 DIAGNOSIS — Z955 Presence of coronary angioplasty implant and graft: Secondary | ICD-10-CM | POA: Diagnosis not present

## 2023-01-19 DIAGNOSIS — I2510 Atherosclerotic heart disease of native coronary artery without angina pectoris: Secondary | ICD-10-CM | POA: Diagnosis not present

## 2023-01-19 DIAGNOSIS — E1169 Type 2 diabetes mellitus with other specified complication: Secondary | ICD-10-CM | POA: Diagnosis not present

## 2023-01-19 DIAGNOSIS — I10 Essential (primary) hypertension: Secondary | ICD-10-CM | POA: Diagnosis not present

## 2023-01-19 DIAGNOSIS — Q615 Medullary cystic kidney: Secondary | ICD-10-CM | POA: Diagnosis not present

## 2023-01-19 DIAGNOSIS — N200 Calculus of kidney: Secondary | ICD-10-CM | POA: Diagnosis not present

## 2023-01-25 DIAGNOSIS — E1140 Type 2 diabetes mellitus with diabetic neuropathy, unspecified: Secondary | ICD-10-CM | POA: Diagnosis not present

## 2023-01-25 DIAGNOSIS — M542 Cervicalgia: Secondary | ICD-10-CM | POA: Diagnosis not present

## 2023-01-25 DIAGNOSIS — E663 Overweight: Secondary | ICD-10-CM | POA: Diagnosis not present

## 2023-01-25 DIAGNOSIS — E785 Hyperlipidemia, unspecified: Secondary | ICD-10-CM | POA: Diagnosis not present

## 2023-01-25 DIAGNOSIS — F5104 Psychophysiologic insomnia: Secondary | ICD-10-CM | POA: Diagnosis not present

## 2023-01-25 DIAGNOSIS — M5412 Radiculopathy, cervical region: Secondary | ICD-10-CM | POA: Diagnosis not present

## 2023-02-01 DIAGNOSIS — I3481 Nonrheumatic mitral (valve) annulus calcification: Secondary | ICD-10-CM | POA: Diagnosis not present

## 2023-02-01 DIAGNOSIS — R011 Cardiac murmur, unspecified: Secondary | ICD-10-CM | POA: Diagnosis not present

## 2023-02-01 DIAGNOSIS — R071 Chest pain on breathing: Secondary | ICD-10-CM | POA: Diagnosis not present

## 2023-02-09 DIAGNOSIS — N200 Calculus of kidney: Secondary | ICD-10-CM | POA: Diagnosis not present

## 2023-02-09 DIAGNOSIS — Q615 Medullary cystic kidney: Secondary | ICD-10-CM | POA: Diagnosis not present

## 2023-02-16 ENCOUNTER — Ambulatory Visit: Payer: Medicare HMO | Admitting: Urology

## 2023-02-22 DIAGNOSIS — J029 Acute pharyngitis, unspecified: Secondary | ICD-10-CM | POA: Diagnosis not present

## 2023-02-22 DIAGNOSIS — E663 Overweight: Secondary | ICD-10-CM | POA: Diagnosis not present

## 2023-02-22 DIAGNOSIS — R0982 Postnasal drip: Secondary | ICD-10-CM | POA: Diagnosis not present

## 2023-02-22 DIAGNOSIS — I10 Essential (primary) hypertension: Secondary | ICD-10-CM | POA: Diagnosis not present

## 2023-02-22 DIAGNOSIS — J019 Acute sinusitis, unspecified: Secondary | ICD-10-CM | POA: Diagnosis not present

## 2023-02-23 ENCOUNTER — Ambulatory Visit: Payer: Medicare PPO | Admitting: Urology

## 2023-03-02 DIAGNOSIS — J019 Acute sinusitis, unspecified: Secondary | ICD-10-CM | POA: Diagnosis not present

## 2023-03-02 DIAGNOSIS — E663 Overweight: Secondary | ICD-10-CM | POA: Diagnosis not present

## 2023-03-02 DIAGNOSIS — I10 Essential (primary) hypertension: Secondary | ICD-10-CM | POA: Diagnosis not present

## 2023-03-09 DIAGNOSIS — H25812 Combined forms of age-related cataract, left eye: Secondary | ICD-10-CM | POA: Diagnosis not present

## 2023-03-09 DIAGNOSIS — H04123 Dry eye syndrome of bilateral lacrimal glands: Secondary | ICD-10-CM | POA: Diagnosis not present

## 2023-03-09 DIAGNOSIS — Z961 Presence of intraocular lens: Secondary | ICD-10-CM | POA: Diagnosis not present

## 2023-03-09 DIAGNOSIS — H40013 Open angle with borderline findings, low risk, bilateral: Secondary | ICD-10-CM | POA: Diagnosis not present

## 2023-04-05 DIAGNOSIS — M545 Low back pain, unspecified: Secondary | ICD-10-CM | POA: Diagnosis not present

## 2023-04-05 DIAGNOSIS — Z0000 Encounter for general adult medical examination without abnormal findings: Secondary | ICD-10-CM | POA: Diagnosis not present

## 2023-04-05 DIAGNOSIS — E119 Type 2 diabetes mellitus without complications: Secondary | ICD-10-CM | POA: Diagnosis not present

## 2023-04-05 DIAGNOSIS — E663 Overweight: Secondary | ICD-10-CM | POA: Diagnosis not present

## 2023-04-05 DIAGNOSIS — Z1331 Encounter for screening for depression: Secondary | ICD-10-CM | POA: Diagnosis not present

## 2023-04-05 DIAGNOSIS — Z1389 Encounter for screening for other disorder: Secondary | ICD-10-CM | POA: Diagnosis not present

## 2023-04-05 DIAGNOSIS — M25551 Pain in right hip: Secondary | ICD-10-CM | POA: Diagnosis not present

## 2023-04-05 DIAGNOSIS — Z23 Encounter for immunization: Secondary | ICD-10-CM | POA: Diagnosis not present

## 2023-04-05 DIAGNOSIS — I10 Essential (primary) hypertension: Secondary | ICD-10-CM | POA: Diagnosis not present

## 2023-04-13 DIAGNOSIS — H401114 Primary open-angle glaucoma, right eye, indeterminate stage: Secondary | ICD-10-CM | POA: Diagnosis not present

## 2023-04-13 DIAGNOSIS — H25812 Combined forms of age-related cataract, left eye: Secondary | ICD-10-CM | POA: Diagnosis not present

## 2023-04-13 DIAGNOSIS — H524 Presbyopia: Secondary | ICD-10-CM | POA: Diagnosis not present

## 2023-04-13 DIAGNOSIS — H52222 Regular astigmatism, left eye: Secondary | ICD-10-CM | POA: Diagnosis not present
